# Patient Record
Sex: Female | Born: 1935 | Race: Black or African American | Hispanic: No | Marital: Married | Smoking: Never smoker
Health system: Southern US, Community
[De-identification: ages and names within clinical notes are randomized; demographics above are authoritative.]

## PROBLEM LIST (undated history)

## (undated) DIAGNOSIS — E785 Hyperlipidemia, unspecified: Secondary | ICD-10-CM

## (undated) DIAGNOSIS — R922 Inconclusive mammogram: Secondary | ICD-10-CM

## (undated) DIAGNOSIS — M1990 Unspecified osteoarthritis, unspecified site: Secondary | ICD-10-CM

## (undated) DIAGNOSIS — R9230 Dense breasts, unspecified: Secondary | ICD-10-CM

## (undated) DIAGNOSIS — G4700 Insomnia, unspecified: Secondary | ICD-10-CM

## (undated) DIAGNOSIS — H34219 Partial retinal artery occlusion, unspecified eye: Secondary | ICD-10-CM

## (undated) DIAGNOSIS — I350 Nonrheumatic aortic (valve) stenosis: Secondary | ICD-10-CM

## (undated) DIAGNOSIS — E789 Disorder of lipoprotein metabolism, unspecified: Secondary | ICD-10-CM

## (undated) DIAGNOSIS — F419 Anxiety disorder, unspecified: Secondary | ICD-10-CM

## (undated) DIAGNOSIS — E669 Obesity, unspecified: Secondary | ICD-10-CM

## (undated) DIAGNOSIS — M810 Age-related osteoporosis without current pathological fracture: Secondary | ICD-10-CM

## (undated) DIAGNOSIS — M8580 Other specified disorders of bone density and structure, unspecified site: Secondary | ICD-10-CM

## (undated) DIAGNOSIS — I10 Essential (primary) hypertension: Secondary | ICD-10-CM

## (undated) HISTORY — DX: Inconclusive mammogram: R92.2

## (undated) HISTORY — DX: Anxiety disorder, unspecified: F41.9

## (undated) HISTORY — DX: Essential (primary) hypertension: I10

## (undated) HISTORY — DX: Unspecified osteoarthritis, unspecified site: M19.90

## (undated) HISTORY — DX: Age-related osteoporosis without current pathological fracture: M81.0

## (undated) HISTORY — DX: Partial retinal artery occlusion, unspecified eye: H34.219

## (undated) HISTORY — DX: Insomnia, unspecified: G47.00

## (undated) HISTORY — DX: Hyperlipidemia, unspecified: E78.5

## (undated) HISTORY — DX: Disorder of lipoprotein metabolism, unspecified: E78.9

## (undated) HISTORY — DX: Obesity, unspecified: E66.9

## (undated) HISTORY — DX: Other specified disorders of bone density and structure, unspecified site: M85.80

## (undated) HISTORY — DX: Dense breasts, unspecified: R92.30

---
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
## 1997-10-12 ENCOUNTER — Encounter: Admission: RE | Admit: 1997-10-12 | Discharge: 1998-01-10 | Payer: Self-pay | Admitting: Family Medicine

## 1998-10-22 ENCOUNTER — Other Ambulatory Visit: Admission: RE | Admit: 1998-10-22 | Discharge: 1998-10-22 | Payer: Self-pay | Admitting: Family Medicine

## 1999-02-14 ENCOUNTER — Encounter: Admission: RE | Admit: 1999-02-14 | Discharge: 1999-05-15 | Payer: Self-pay | Admitting: Family Medicine

## 1999-05-16 ENCOUNTER — Encounter: Admission: RE | Admit: 1999-05-16 | Discharge: 1999-08-14 | Payer: Self-pay | Admitting: Family Medicine

## 2000-07-28 ENCOUNTER — Encounter: Payer: Self-pay | Admitting: Emergency Medicine

## 2000-07-28 ENCOUNTER — Emergency Department (HOSPITAL_COMMUNITY): Admission: EM | Admit: 2000-07-28 | Discharge: 2000-07-29 | Payer: Self-pay | Admitting: Emergency Medicine

## 2000-07-31 ENCOUNTER — Encounter: Payer: Self-pay | Admitting: Orthopedic Surgery

## 2000-07-31 ENCOUNTER — Ambulatory Visit (HOSPITAL_COMMUNITY): Admission: RE | Admit: 2000-07-31 | Discharge: 2000-08-02 | Payer: Self-pay | Admitting: Orthopedic Surgery

## 2000-09-11 ENCOUNTER — Encounter: Admission: RE | Admit: 2000-09-11 | Discharge: 2000-12-10 | Payer: Self-pay | Admitting: Orthopedic Surgery

## 2000-12-15 ENCOUNTER — Encounter: Admission: RE | Admit: 2000-12-15 | Discharge: 2001-02-09 | Payer: Self-pay | Admitting: Orthopedic Surgery

## 2001-02-24 ENCOUNTER — Observation Stay (HOSPITAL_COMMUNITY): Admission: RE | Admit: 2001-02-24 | Discharge: 2001-02-25 | Payer: Self-pay | Admitting: Orthopedic Surgery

## 2001-03-18 ENCOUNTER — Encounter: Admission: RE | Admit: 2001-03-18 | Discharge: 2001-06-16 | Payer: Self-pay | Admitting: Orthopedic Surgery

## 2001-05-23 ENCOUNTER — Emergency Department (HOSPITAL_COMMUNITY): Admission: EM | Admit: 2001-05-23 | Discharge: 2001-05-23 | Payer: Self-pay | Admitting: *Deleted

## 2001-05-23 ENCOUNTER — Encounter: Payer: Self-pay | Admitting: Emergency Medicine

## 2001-09-21 ENCOUNTER — Other Ambulatory Visit: Admission: RE | Admit: 2001-09-21 | Discharge: 2001-09-21 | Payer: Self-pay | Admitting: Obstetrics and Gynecology

## 2001-11-12 ENCOUNTER — Encounter: Payer: Self-pay | Admitting: Family Medicine

## 2001-11-12 ENCOUNTER — Encounter: Admission: RE | Admit: 2001-11-12 | Discharge: 2001-11-12 | Payer: Self-pay | Admitting: Family Medicine

## 2001-12-09 ENCOUNTER — Encounter: Payer: Self-pay | Admitting: Orthopedic Surgery

## 2001-12-13 ENCOUNTER — Inpatient Hospital Stay (HOSPITAL_COMMUNITY): Admission: RE | Admit: 2001-12-13 | Discharge: 2001-12-15 | Payer: Self-pay | Admitting: Orthopedic Surgery

## 2001-12-13 ENCOUNTER — Encounter: Payer: Self-pay | Admitting: Orthopedic Surgery

## 2002-02-24 ENCOUNTER — Encounter: Payer: Self-pay | Admitting: Family Medicine

## 2002-02-24 ENCOUNTER — Ambulatory Visit (HOSPITAL_COMMUNITY): Admission: RE | Admit: 2002-02-24 | Discharge: 2002-02-24 | Payer: Self-pay | Admitting: Family Medicine

## 2003-02-07 ENCOUNTER — Other Ambulatory Visit: Admission: RE | Admit: 2003-02-07 | Discharge: 2003-02-07 | Payer: Self-pay | Admitting: Obstetrics and Gynecology

## 2004-02-08 ENCOUNTER — Other Ambulatory Visit: Admission: RE | Admit: 2004-02-08 | Discharge: 2004-02-08 | Payer: Self-pay | Admitting: Obstetrics and Gynecology

## 2004-02-15 ENCOUNTER — Encounter: Admission: RE | Admit: 2004-02-15 | Discharge: 2004-02-15 | Payer: Self-pay | Admitting: Obstetrics and Gynecology

## 2004-10-15 ENCOUNTER — Encounter: Admission: RE | Admit: 2004-10-15 | Discharge: 2004-10-15 | Payer: Self-pay | Admitting: Family Medicine

## 2005-07-16 ENCOUNTER — Other Ambulatory Visit: Admission: RE | Admit: 2005-07-16 | Discharge: 2005-07-16 | Payer: Self-pay | Admitting: Obstetrics and Gynecology

## 2005-08-04 ENCOUNTER — Encounter: Admission: RE | Admit: 2005-08-04 | Discharge: 2005-08-04 | Payer: Self-pay | Admitting: Obstetrics and Gynecology

## 2006-01-31 ENCOUNTER — Emergency Department (HOSPITAL_COMMUNITY): Admission: EM | Admit: 2006-01-31 | Discharge: 2006-01-31 | Payer: Self-pay | Admitting: Emergency Medicine

## 2007-07-07 DIAGNOSIS — G4700 Insomnia, unspecified: Secondary | ICD-10-CM

## 2007-07-07 HISTORY — DX: Insomnia, unspecified: G47.00

## 2007-10-07 ENCOUNTER — Ambulatory Visit (HOSPITAL_COMMUNITY): Admission: RE | Admit: 2007-10-07 | Discharge: 2007-10-07 | Payer: Self-pay | Admitting: Endocrinology

## 2008-10-09 ENCOUNTER — Ambulatory Visit (HOSPITAL_COMMUNITY): Admission: RE | Admit: 2008-10-09 | Discharge: 2008-10-09 | Payer: Self-pay | Admitting: Family Medicine

## 2009-10-10 ENCOUNTER — Ambulatory Visit (HOSPITAL_COMMUNITY): Admission: RE | Admit: 2009-10-10 | Discharge: 2009-10-10 | Payer: Self-pay | Admitting: Family Medicine

## 2010-05-24 NOTE — Op Note (Signed)
[place]. [facility]  Patient:    Susan Randall, Susan Randall                      MRN: [id number] Proc. Date: 07/31/00 Adm. Date:  [id number] Disc. Date: [id number] Attending:  Wende Mott                           Operative Report  PREOPERATIVE DIAGNOSIS: Comminuted left humeral head and neck fracture with angulation.  POSTOPERATIVE DIAGNOSIS: Comminuted left humeral head and neck fracture with angulation.  OPERATION/PROCEDURE: Closed manipulative reduction with percutaneous pinning and fixation of left humeral head with three pins.  SURGEON: Kennieth Rad, M.D.  ANESTHESIA: General.  DESCRIPTION OF PROCEDURE: The patient was taken to the operating room after being given adequate preoperative medication and was placed in a barber chair position.  The C-arm was used to visualize for fracture reduction.  The left shoulder was prepped with DuraPrep and draped in sterile manner.  Manipulative reduction of the humerus of the shoulder was done, followed by good reduction and percutaneous pinning with the use of three K-wires across the fracture site, stabilizing it.  This was visualized with the use of the C-arm.  The pins were cut on the outside and bent and pin covers applied.  Adaptic was applied followed by a compressive dressing and swing and swathe to immobilize. The patient tolerated the procedure quite well and went to the recovery room in stable and satisfactory condition.  The patient was discharged home after 23 hour observation for severe nausea and vomiting after the use of Percocet. The patient was switched over to Darvocet-N 100 one q.4h p.r.n. for pain and Vioxx 25 mg q.d., to return to the office in one week.  The patient was discharged in stable and satisfactory condition.  To continue ice packs. DD:  08/12/00 TD:  08/13/00 Job: [id number]

## 2010-05-24 NOTE — H&P (Signed)
[place]. [facility]  Patient:    Susan Randall, Susan Randall                      MRN: [id number] Adm. Date:  [id number] Disc. Date: [id number] Attending:  Wende Mott                         History and Physical  CHIEF COMPLAINT: Painful swollen left shoulder.  HISTORY OF PRESENT ILLNESS: This is a 77 year old female seen in the office for injury she sustained to her left shoulder.  The patient had a fall at home and went down onto her left shoulder.  The patient initially was seen at [facility]. [facility] Emergency Room and placed in a sling and subsequently followed up in the office.  The patient complained of pain and swelling of the left shoulder and arm.  PAST MEDICAL HISTORY: Appendectomy in the past.  No history of high blood pressure or diabetes.  ALLERGIES: None known.  MEDICATIONS:  1. Multivitamin.  2. Oxycodone.  FAMILY HISTORY: Noncontributory.  SOCIAL HISTORY: Habits, drinks alcohol occasionally.  REVIEW OF SYSTEMS: No cardiac, respiratory, urinary or bowel symptoms.  PHYSICAL EXAMINATION:  VITAL SIGNS: Temperature 98.6 degrees, pulse 98, respirations 16, blood pressure 124/76.  Height 5 feet 4 inches.  Weight 180 pounds.  GENERAL: Alert and oriented, no acute distress.  HEENT: Head normocephalic.  Conjunctivae and sclerae clear.  NECK: Supple.  CHEST: Clear.  CARDIAC: S1 and S2, regular.  EXTREMITIES: Left shoulder ecchymotic, swollen, markedly tender.  Good grip and pinch in left hand with swelling noted down to the fingers.  Pulses intact.  Nail beds pink and moist.  LABORATORY DATA: X-rays reveal comminuted angulated humeral head fracture with intra-articular involvement.  IMPRESSION: Comminuted left humeral head fracture. DD:  08/12/00 TD:  08/13/00 Job: [id number]

## 2010-05-24 NOTE — Op Note (Signed)
Susan Randall, Susan Randall                        ACCOUNT NO.:  [id number]   MEDICAL RECORD NO.:  [id number]                   PATIENT TYPE:   LOCATION:                                       FACILITY:   PHYSICIAN:  Mila Homer. Sherlean Foot, M.D.              DATE OF BIRTH:  1928/10/09   DATE OF PROCEDURE:  12/13/2001  DATE OF DISCHARGE:  12/15/2001                                 OPERATIVE REPORT   PREOPERATIVE DIAGNOSIS:  Post traumatic arthritis of the left shoulder.   POSTOPERATIVE DIAGNOSIS:  Post traumatic arthritis of the left shoulder.   OPERATION PERFORMED:  Left shoulder hemiarthroplasty.   SURGEON:  Mila Homer. Sherlean Foot, M.D.   ASSISTANT:  Jamelle Rushing, P.A.   ANESTHESIA:  General.   INDICATIONS FOR PROCEDURE:  The patient is a greater than a year out from a  proximal humerus fracture which healed in a malaligned position and was  causing pain despite conservative treatment.  Informed consent was obtained  for hemiarthroplasty versus total shoulder arthroplasty.   DESCRIPTION OF PROCEDURE:  The patient was laid supine and administered  general anesthesia.  He was then placed in the beach chair position and the  left shoulder was prepped and draped in the usual sterile fashion.  A  straight incision was made from the coracoid process down to the insertion  of the deltoid anteriorly.  Cautery dissection was used.  I developed flaps.  I then identified the deltopectoral interval, isolated off the vein and  brought that laterally with the deltoid.  I then placed a __________  retractor in place and then elevated the subscapularis tendon and anterior  shoulder capsule and tied those with four stay sutures.  I then removed the  capsule from the undersurface of the subscapularis tendon and placed the  subscap tendon behind the __________ retractor.  It was then obvious that  the head had a healed and a very varus and retroverted position.  I could  not gain access to the canal  without osteotomizing the greater tuberosity.  I therefore did perform an osteotomy of the greater tuberosity and tied the  biceps tendon and pulled it out of the way.  At this point I gained direct  entry to the shaft and reamed up to a size to press-fit in a size 10.  I  then used a head cutting guide to removed what I felt was an adequate height  of bone.  I then broached with a size 9 broach and trialed various heads to  a 52 diameter, 4 mm offset head which gave good stability, good head height  and good coverage.  At this point I irrigated, placed drill holes in the  tuberosity fragments, drill holes in the inferior shaft as well.  At this  point I tamped down the real #10 prosthesis, size 10 prosthesis and placed  fiber wire through a drill hole in the  greater tuberosity fragment through  the lateral fin of the prosthesis and tied it down securely.  This reduced  very nicely.  I then brought the subscap tendon down and closed it to the  anterior bone and then oversewed our rotator cuff split.  At this point we  had excellent coverage, good internal and external rotation with no tension  on the repair.  I then irrigated and closed with interrupted 0 Vicryl in the  deltoid interval, 0 Vicryls in the deep soft tissues, running 2-0 Vicryls  and skin staples.  Dressed with Adaptic, 4 x 4s, sterile ABDs and 2-inch  silk tape and a sling swath mobilizer.  The patient tolerated the procedure  well.   COMPLICATIONS:  None.   DRAINS:  None.   ESTIMATED BLOOD LOSS:  300 cc.                                                Mila Homer. Sherlean Foot, M.D.    SDL/MEDQ  D:  12/13/2001  T:  12/13/2001  Job:  [id number]

## 2010-05-24 NOTE — Op Note (Signed)
[place]. [facility]  Patient:    Susan Randall, Susan Randall Visit Number: [id number] MRN: [id number]          Service Type: OBV Location: [phone] Attending Physician:  Wende Mott Dictated by:   Kennieth Rad, M.D. Proc. Date: 02/24/01 Admit Date:  02/24/2001 Discharge Date: 02/25/2001                             Operative Report  PREOPERATIVE DIAGNOSES:  Impingement syndrome, left shoulder; subacromial bursitis; rule out rotator cuff tear.  POSTOPERATIVE DIAGNOSES:  Impingement syndrome, subacromial bursitis.  PROCEDURE:  Arthroscopy, arthroscopic acromioplasty and decompression and synovectomy of left shoulder.  SURGEON:  Kennieth Rad, M.D.  ANESTHESIA:  DESCRIPTION OF PROCEDURE:  Patient was taken to the operating room after giving adequate preop medication and given general anesthesia and intubated. Patient was placed in a barber chair position.  Left shoulder was prepped with DuraPrep and draped in a sterile manner.  Incision was made posterior over the shoulder.  ______ rod was placed from posterior to anterior.  Anterior incision was made with inflow of water anteriorly.  Arthroscope was placed laterally through a separate one-half-inch incision.  Inspection of the joint revealed tremendously overgrown subacromial bursitis with entrapment down against the rotator cuff, some fibrillation of the cuff tendon, but the rotator cuff was still intact, subacromial chondromalacial changes with fibrillation and fibrosis of the surface.  With the use of the shaver, complete synovectomy was done and smoothing of the undersurface of the acromion.  Coracohumeral ligament was resected starting from posterior to anterior.  Acromioplasty was done and this allowed the shoulder to actually come into a good 90 degrees of abduction without impingement.  Copious and abundant irrigation was done.  Further local debridement of the cuff tendon itself  was also done.  Wounds were then closed with 4-0 nylon.  Marcaine 0.5% was injected into the shoulder.  Compressive dressing was applied. Patient tolerated the procedure quite well and went to recovery room in stable and satisfactory condition.  Patient is being kept in 23-hour observation, to be discharged tomorrow, once the pain is under control.  Patient is to return to the office in one week.   Discharged with Darvocet-N 100 one q.4h. p.r.n. for pain.  Continue ice packs and immobilizing splint and to return to the office in one week.Dictated by: Arva Chafe, M.D. Attending Physician:  Wende Mott DD:  03/10/01 TD:  03/11/01 Job: [id number]

## 2010-05-24 NOTE — H&P (Signed)
Susan Randall, Susan Randall                         ACCOUNT NO.:  [id number]   MEDICAL RECORD NO.:  [id number]                   PATIENT TYPE:  INP   LOCATION:  NA                                   FACILITY:  [facility]   PHYSICIAN:  Mila Homer. Sherlean Foot, M.D.              DATE OF BIRTH:  1928-11-04   DATE OF ADMISSION:  12/13/2001  DATE OF DISCHARGE:                                HISTORY & PHYSICAL   CHIEF COMPLAINT:  Left shoulder pain since July of 2002.   HISTORY OF PRESENT ILLNESS:  This 77 year old, black, female patient  presented to Susan Randall, M.D., with a history of a left proximal  humerus fracture in July of 2002.  That was fixed with percutaneous pinning  by Susan Randall, M.D.  She went through a course of physical therapy, but  continued to have pain in that shoulder and then subsequently underwent a  left shoulder arthroscopy on February 24, 2001, with a subacromial  decompression and synovectomy by Susan Randall.  Since that time, she has been  in physical therapy, but has continued to have pain in that left shoulder.   At this point, the pain is intermittent and is described as a throbbing  sensation located in the deltoid region with radiation down into her elbow.  It has been pretty much the same level since her last surgery with no real  progression of the amount of pain she has.  The pain is present with any  range of motion of the shoulder if she sleeps on that left side and it  decreases if she holds the arm still and at her side.  She is currently  taking some Bextra for pain and that provides some relief.  There is some  popping of the shoulder at times, but no neck pain, paresthesias, grinding,  locking, or any other problems with it.   ALLERGIES:  No known drug allergies.   CURRENT MEDICATIONS:  1. Prempro 0.625/2.5 mg one tablet p.o. q.d.  2. Bextra 20 mg one tablet p.o. q.d.  3. Centrum multivitamins one tablet p.o. q.d.  4. Caltrate 600 mg one tablet p.o.  q.d.   PAST MEDICAL HISTORY:  She does have some problems with intermittent sinus  congestion, but no other multiple medical problems.  She denies any history  of diabetes mellitus, hypertension, thyroid disease, hiatal hernia, peptic  ulcer disease, reflux, heart disease, asthma, or any other chronic medical  condition.   PAST SURGICAL HISTORY:  1. Appendectomy in 1944.  2. Left proximal humerus percutaneous pinning by Susan Randall, M.D., on     July 31, 2000.  3. Left shoulder arthroscopy with subacromial decompression and synovectomy     by Susan Randall, M.D., on February 24, 2001.   SOCIAL HISTORY:  She does drink alcohol just socially, probably two to three  times a month.  She denies any history of cigarette smoking  or drug use.  She is married and has one Randall.  She and her husband live in a two-  story house with two steps into the main entrance.  She is a retired  Programmer, systems.  Her medical doctor is Susan Randall, M.D.   FAMILY HISTORY:  Her mother died at the age of 99 with diabetes and a  stroke.  Her father died at age 29 of old age.  She had one brother who died  at age 56 with a history of alcoholism and pneumonia and one sister who died  at age 71 with cancer.  They were unsure if it was stomach, pancreatic, or  ovarian.  Her Randall is 77 years old and she is alive and well.   REVIEW OF SYMPTOMS:  She does wear partial plate dentures on her upper jaw  line.  She wears glasses.  She complains of some knee pain bilaterally just  with the cold weather.  She does have a living will.  Her power-of-attorney  is __________ Susan Randall.  All other systems are negative and  noncontributory.   PHYSICAL EXAMINATION:  GENERAL APPEARANCE:  A well-developed, well-  nourished, overweight, black female in no acute distress.  She walks without  a limp.  Mood and affect are appropriate.  She talks easily with the  examiner.  HEIGHT:  5 feet 3 inches.  WEIGHT:  190  pounds.  BODY MASS INDEX:  33.  VITAL SIGNS:  Temperature 97.2 degrees Fahrenheit, pulse 84, respirations  22, BP 158/90.  HEENT:  Normocephalic and atraumatic without frontal or maxillary sinus  tenderness to palpation.  Conjunctivae pink.  Sclerae anicteric.  PERRLA.  EOMs intact.  No visible external ear deformities.  Hearing grossly intact.  Tympanic membranes pearly gray bilaterally with good light reflex.  Nose and  nasal septum midline.  Nasal mucosa pink and moist without exudates or  polyps noted.  Buccal mucosa pink and moist.  Good dentition.  Pharynx  without erythema or exudates.  Tongue and uvula midline.  Tongue without  fasciculations.  The uvula rises equally with phonation.  NECK:  No visible masses or lesions noted.  Trachea midline.  No palpable  lymphadenopathy or thyromegaly.  Carotids +2 bilaterally without bruits.  Full range of motion.  Nontender to palpation along the cervical spine.  CARDIOVASCULAR:  Heart rate and rhythm regular.  S1 and S2 present without  rubs, clicks, or murmurs noted.  RESPIRATORY:  Respirations even and unlabored.  Breath sounds clear to  auscultation bilaterally without rales or wheezes noted.  ABDOMEN:  Rounded abdominal contour.  Bowel sounds present x 4 quadrants.  Soft and nontender to palpation without hepatosplenomegaly nor CVA  tenderness.  Nontender to palpation along the entire length of the vertebral  column.  Femoral pulses are +2 bilaterally.  BREASTS:  Deferred at this time.  GENITOURINARY:  Deferred at this time.  RECTAL:  Deferred at this time.  PELVIC:  Deferred at this time.  MUSCULOSKELETAL:  No obvious deformities of bilateral hips, knees, ankles,  and toes.  Full range of motion of these extremities without pain.  DP and  PT pulses are +2.  No lower extremity edema.  She has full range of motion  of her wrists and elbows bilaterally.  Radial pulses are +2.  She has full range of motion of the right shoulder without  pain.  No pain with palpation  about the shoulder on the right.  Forward flexion to 180 degrees.  Abduction  180 degrees.  Full internal and external rotation.  Impingement testing  negative on the right.  On the left shoulder, the skin is intact without  erythema or ecchymosis, but she has four little puncture sites noted about  the top part of her shoulder and one arthroscopy portal site that appears to  be almost an X-type of scar on the anterior aspect of her shoulder.  No  active drainage.  She does have a moderate amount of crepitus with range of  motion of the left shoulder.  She does complain of pain with range of motion  of the left shoulder.  She only has forward flexion to about 90 degrees of  the left shoulder, abduction to 70, markedly decreased external rotation,  and minimally decreased internal rotation of that left shoulder.  Radial  pulses +2.  Arm otherwise neurovascularly intact.  NEUROLOGIC:  Alert and oriented x 3.  Cranial nerves II-XII were grossly  intact.  Strength 5/5 in bilateral upper and lower extremities.  Rapid  alternating movements intact.  Deep tendon reflexes 2+ in bilateral upper  and lower extremities.   RADIOLOGIC FINDINGS:  X-rays taken of her left shoulder in October of 2003  show an old healed humeral head fracture in poor position.  The humeral head  is pointing far into the inferior glenoid.  There is a large spica bone  noted superiorly at the greater tuberosity.   IMPRESSION:  1. Painful left shoulder, status post humeral head fracture with     percutaneous pinning and arthroscopic debridement.  2. Sinus congestion.   PLAN:  The patient will be admitted to [facility] on  December 13, 2001, where she will undergo a left total versus hemishoulder  arthroplasty by Mila Homer. Sherlean Foot, M.D.  She will undergo all of the routine  preoperatively tests and studies prior to this procedure.      Legrand Pitts Duffy, P.A.                       Mila Homer. Sherlean Foot, M.D.    KED/MEDQ  D:  12/07/2001  T:  12/07/2001  Job:  [id number]

## 2010-05-24 NOTE — H&P (Signed)
[place]. [facility]  Patient:    Susan Randall, Susan Randall Visit Number: [id number] MRN: [id number]          Service Type: OBV Location: [phone] Attending Physician:  Wende Mott Dictated by:   Kennieth Rad, M.D. Admit Date:  02/24/2001 Discharge Date: 02/25/2001                           History and Physical  CHIEF COMPLAINT:  Painful stiff left shoulder.  HISTORY OF PRESENT ILLNESS:  This is a 77 year old female who had just recently been treated for a comminuted fracture of the humeral neck and underwent multiple pinning of her fracture.  Patient had undergone physical therapy and done fairly well and had some new onset of pain in the left shoulder and increased difficulty in abducting and forward extending the arm. Patient had some loss of function after some stretching and doing a physical therapy treatment.  PAST MEDICAL HISTORY:  Closed reduction and pinning of left humeral fracture, appendectomy.  No history of high blood pressure or diabetes.  ALLERGIES:  None known.  MEDICATIONS:  Bextra and Premarin.  HABITS:  Occasional use of alcohol.  FAMILY HISTORY:  Noncontributory.  REVIEW OF SYSTEMS:  Basically, patient has been in good health.  No cardiac or respiratory and no urinary or bowel symptoms.  PHYSICAL EXAMINATION:  VITAL SIGNS:  Temperature 98.7, pulse 88, respirations 16, blood pressure 120/70.  Height 5 feet 3 inches.  Weight 180.  HEENT:  Normocephalic.  Eyes:  Conjunctivae and sclerae clear.  NECK:  Supple.  CHEST:  Clear.  CARDIAC:  S1 and S2 regular.  EXTREMITIES:  Left shoulder range of motion is limited, tender anteriorly and laterally, subacromial crepitus, pain on abduction above 70 degrees of abduction and forward extension, pinching sensation about anterolateral aspect of the shoulder.  Neurovascular status is intact.  IMPRESSION: 1. Impingement syndrome, left shoulder. 2. Subacromial bursitis,  left shoulder. Dictated by:   Kennieth Rad, M.D. Attending Physician:  Wende Mott DD:  03/10/01 TD:  03/11/01 Job: [id number]

## 2010-07-24 ENCOUNTER — Ambulatory Visit (HOSPITAL_COMMUNITY): Admission: RE | Admit: 2010-07-24 | Payer: Medicare Other | Source: Ambulatory Visit | Admitting: Ophthalmology

## 2010-09-03 ENCOUNTER — Ambulatory Visit (HOSPITAL_COMMUNITY)
Admission: RE | Admit: 2010-09-03 | Discharge: 2010-09-03 | Disposition: A | Discharge status: Home / Self Care | Payer: Medicare Other | Source: Ambulatory Visit | Attending: Ophthalmology | Admitting: Ophthalmology

## 2010-09-03 DIAGNOSIS — H269 Unspecified cataract: Secondary | ICD-10-CM | POA: Insufficient documentation

## 2010-09-23 ENCOUNTER — Ambulatory Visit (HOSPITAL_COMMUNITY)
Admission: RE | Admit: 2010-09-23 | Discharge: 2010-09-23 | Disposition: A | Discharge status: Home / Self Care | Payer: Medicare Other | Source: Ambulatory Visit | Attending: Ophthalmology | Admitting: Ophthalmology

## 2010-09-23 DIAGNOSIS — H2640 Unspecified secondary cataract: Secondary | ICD-10-CM | POA: Insufficient documentation

## 2010-10-10 ENCOUNTER — Ambulatory Visit (HOSPITAL_COMMUNITY)
Admission: RE | Admit: 2010-10-10 | Discharge: 2010-10-10 | Disposition: A | Discharge status: Home / Self Care | Payer: Medicare Other | Source: Ambulatory Visit | Attending: Ophthalmology | Admitting: Ophthalmology

## 2010-10-10 ENCOUNTER — Ambulatory Visit (HOSPITAL_COMMUNITY): Payer: Medicare Other

## 2010-10-10 DIAGNOSIS — Z01818 Encounter for other preprocedural examination: Secondary | ICD-10-CM | POA: Insufficient documentation

## 2010-10-10 DIAGNOSIS — I10 Essential (primary) hypertension: Secondary | ICD-10-CM | POA: Insufficient documentation

## 2010-10-10 DIAGNOSIS — H44009 Unspecified purulent endophthalmitis, unspecified eye: Secondary | ICD-10-CM | POA: Insufficient documentation

## 2010-10-10 DIAGNOSIS — Z01812 Encounter for preprocedural laboratory examination: Secondary | ICD-10-CM | POA: Insufficient documentation

## 2010-10-10 DIAGNOSIS — Z01810 Encounter for preprocedural cardiovascular examination: Secondary | ICD-10-CM | POA: Insufficient documentation

## 2010-10-10 LAB — SURGICAL PCR SCREEN: MRSA, PCR: NEGATIVE

## 2010-10-10 LAB — BASIC METABOLIC PANEL
Calcium: 9.6 mg/dL (ref 8.4–10.5)
Chloride: 103 mEq/L (ref 96–112)
Creatinine, Ser: 0.95 mg/dL (ref 0.50–1.10)
GFR calc Af Amer: 64 mL/min — ABNORMAL LOW (ref >90)
GFR calc non Af Amer: 55 mL/min — ABNORMAL LOW (ref >90)

## 2010-10-10 LAB — CBC
MCH: 28.1 pg (ref 26.0–34.0)
MCHC: 33.8 g/dL (ref 30.0–36.0)
MCV: 83 fL (ref 78.0–100.0)
Platelets: 155 10*3/uL (ref 150–400)
RDW: 14.7 % (ref 11.5–15.5)
WBC: 7.1 10*3/uL (ref 4.0–10.5)

## 2010-10-15 ENCOUNTER — Ambulatory Visit (HOSPITAL_COMMUNITY): Payer: Medicare Other | Attending: Family Medicine

## 2010-10-15 DIAGNOSIS — M810 Age-related osteoporosis without current pathological fracture: Secondary | ICD-10-CM | POA: Insufficient documentation

## 2010-10-18 ENCOUNTER — Encounter (HOSPITAL_COMMUNITY): Payer: Medicare Other

## 2010-10-23 NOTE — Op Note (Signed)
Susan Randall, Susan Randall               ACCOUNT NO.:  [id number]  MEDICAL RECORD NO.:  [id number]  LOCATION:  [facility]SC                         FACILITY:  [facility]  PHYSICIAN:  Shade Flood, MD       DATE OF BIRTH:  02-10-1928  DATE OF PROCEDURE:  10/10/2010 DATE OF DISCHARGE:  10/10/2010                              OPERATIVE REPORT   PROCEDURE PERFORMED:  Pars plana vitrectomy with intravitreal injection of antibiotics.  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  SURGEON OF RECORD:  Shade Flood, MD  ESTIMATED BLOOD LOSS:  Less than 1 mL.  There were no complications and no specimens for pathology.  The patient was prepared and draped in the usual fashion for ocular surgery on the right eye and a solid lid speculum was placed.  A single trocar cannula was placed at the 7:30 meridian, 3.5 mm posterior to the surgical limbus.  The vitreous cutter attached to a 3-mL syringe was used to remove exudate from the anterior vitreous and this was placed on the back table that was sterile for placement of culture media.  The infusion line was then attached and infusion allowed to run to reconstitute globe volume.  A 15-degree blade was then used to enter the peripheral clear cornea at the 12 o'clock meridian and the infusion was clamped.  A 27-gauge cannula was then used to remove cellular debris and inferior exudate from the anterior chamber.  The syringe also was placed on the back table for culture.  The anterior chamber remained formed. Two additional trocar cannulas were placed at 9:30 and 2:3- and then trocars were removed.  The light pipe and vitreous cutter were inserted and with wide-field contact lens, we were able to visualize stringy exudate in the vitreous cavity involving the vitreous and the posterior vitreous face was attached.  The vitreous cutter was used to debulk the core vitreous and then the vitreous cutter on aspiration mode was used to separate posterior  vitreous face from the retinal surface.  The vitreous, which was clouded with exudate, was then removed up to the vitreous base for 360 degrees.    The patient had scant peripheral retinal blot hemorrhages noted 360 degrees and 4 foci of exudate at the inferior aspect at the anterior vitreous which was dependent portion of the eye.  The individual globs of exudate were removed with vitreous cutter from the inferior preretinal surface.  After completion of vitreous removal, the retina was inspected and there were no peripheral breaks for 360 degrees.  The cannula was removed from the 2:30 meridian and the infusion line was clamped.  The patient was given 4 mg of Decadron intravitreally, 1 mg of Vancomycin intravitreally, and 2.25 mg of ceftazidime intravitreally through the 9:30 cannula.  The cannula was then removed with concomitant closure with a cotton tip applicator and infusion was turned on to reconstitute the globe volume at less than 20 mmHg.  Once the globe was reconstituted, the infusion cannula was removed with concomitant closure of the sclerotomy site using a cotton tip applicator.  The patient was then given 100 mg of Ancef and 0.5 mL of fluid subconjunctivally in the inferior nasal quadrant.  The patient additionally was given vancomycin 25 mg subconjunctivally in the inferior nasal quadrant.  Both injections were done with the tip of the needle visualized in the subconjunctival space and was placed in the nasal quadrant so that it would be far from the sites of the sclerotomies.  The lid speculum was removed and the patient's eye was patched with polymyxin, bacitracin ophthalmic ointment.  A plastic shield was placed and she was uneventfully extubated.  She was transferred from the operating room to postoperative recovery area with stable vital signs.          ______________________________ Shade Flood, MD     GG/MEDQ  D:  10/10/2010  T:  10/10/2010  Job:   [id number]  Electronically Signed by Shade Flood MD on 10/23/2010 02:51:39 PM

## 2010-11-07 LAB — CULTURE, FUNGUS WITHOUT SMEAR

## 2011-02-04 DIAGNOSIS — H2510 Age-related nuclear cataract, unspecified eye: Secondary | ICD-10-CM | POA: Diagnosis not present

## 2011-04-08 DIAGNOSIS — H1045 Other chronic allergic conjunctivitis: Secondary | ICD-10-CM | POA: Diagnosis not present

## 2011-04-08 DIAGNOSIS — H5231 Anisometropia: Secondary | ICD-10-CM | POA: Diagnosis not present

## 2011-04-08 DIAGNOSIS — H269 Unspecified cataract: Secondary | ICD-10-CM | POA: Diagnosis not present

## 2011-04-28 DIAGNOSIS — H269 Unspecified cataract: Secondary | ICD-10-CM | POA: Diagnosis not present

## 2011-05-20 ENCOUNTER — Other Ambulatory Visit: Payer: Self-pay | Admitting: Family Medicine

## 2011-05-20 ENCOUNTER — Ambulatory Visit
Admission: RE | Admit: 2011-05-20 | Discharge: 2011-05-20 | Disposition: A | Discharge status: Home / Self Care | Payer: Medicare Other | Source: Ambulatory Visit | Attending: Family Medicine | Admitting: Family Medicine

## 2011-05-20 DIAGNOSIS — R0689 Other abnormalities of breathing: Secondary | ICD-10-CM

## 2011-05-20 DIAGNOSIS — R609 Edema, unspecified: Secondary | ICD-10-CM | POA: Diagnosis not present

## 2011-05-20 DIAGNOSIS — R05 Cough: Secondary | ICD-10-CM | POA: Diagnosis not present

## 2011-06-03 DIAGNOSIS — R439 Unspecified disturbances of smell and taste: Secondary | ICD-10-CM | POA: Diagnosis not present

## 2011-06-03 DIAGNOSIS — J321 Chronic frontal sinusitis: Secondary | ICD-10-CM | POA: Diagnosis not present

## 2011-06-26 DIAGNOSIS — H2510 Age-related nuclear cataract, unspecified eye: Secondary | ICD-10-CM | POA: Diagnosis not present

## 2011-07-09 DIAGNOSIS — H2510 Age-related nuclear cataract, unspecified eye: Secondary | ICD-10-CM | POA: Diagnosis not present

## 2011-07-11 ENCOUNTER — Other Ambulatory Visit: Payer: Self-pay | Admitting: Family Medicine

## 2011-07-11 DIAGNOSIS — E7800 Pure hypercholesterolemia, unspecified: Secondary | ICD-10-CM

## 2011-07-14 ENCOUNTER — Ambulatory Visit
Admission: RE | Admit: 2011-07-14 | Discharge: 2011-07-14 | Disposition: A | Discharge status: Home / Self Care | Payer: Medicare Other | Source: Ambulatory Visit | Attending: Family Medicine | Admitting: Family Medicine

## 2011-07-14 DIAGNOSIS — I658 Occlusion and stenosis of other precerebral arteries: Secondary | ICD-10-CM | POA: Diagnosis not present

## 2011-07-14 DIAGNOSIS — E7800 Pure hypercholesterolemia, unspecified: Secondary | ICD-10-CM

## 2011-07-21 ENCOUNTER — Encounter: Payer: Self-pay | Admitting: Vascular Surgery

## 2011-07-31 ENCOUNTER — Encounter: Payer: Self-pay | Admitting: Vascular Surgery

## 2011-07-31 DIAGNOSIS — B361 Tinea nigra: Secondary | ICD-10-CM | POA: Diagnosis not present

## 2011-08-01 ENCOUNTER — Encounter: Payer: Self-pay | Admitting: Vascular Surgery

## 2011-08-01 ENCOUNTER — Ambulatory Visit (INDEPENDENT_AMBULATORY_CARE_PROVIDER_SITE_OTHER): Payer: Medicare Other | Admitting: Vascular Surgery

## 2011-08-01 VITALS — BP 152/80 | HR 70 | Temp 98.3°F | Ht 63.0 in | Wt 189.0 lb

## 2011-08-01 DIAGNOSIS — I6523 Occlusion and stenosis of bilateral carotid arteries: Secondary | ICD-10-CM

## 2011-08-01 DIAGNOSIS — I6529 Occlusion and stenosis of unspecified carotid artery: Secondary | ICD-10-CM | POA: Insufficient documentation

## 2011-08-01 DIAGNOSIS — I658 Occlusion and stenosis of other precerebral arteries: Secondary | ICD-10-CM | POA: Diagnosis not present

## 2011-08-01 NOTE — Progress Notes (Signed)
[facility] [place]  New Carotid Patient  Referred by:  Geraldo Pitter, MD [address] [place], [place] [address]  Reason for referral: cholesterol in eye  History of Present Illness  Susan Randall is a 78 y.o. ([birth year]) female who presents with chief complaint: cholesterol in eye.  The patient is in the process of being evaluated for cataract surgery.  On eye exam, she believe her right eye had cholesterol in the eye on exam.  Previous carotid studies demonstrated: RICA <50% stenosis, LICA <50% stenosis.  Patient has no history of TIA or stroke symptom.  The patient has never had amaurosis fugax or monocular blindness.  The patient has never had facial drooping or hemiplegia.  The patient has never had receptive or expressive aphasia.   The patient's risks factors for carotid disease include: hyperlipidemia and hypertension.  Past Medical History  Diagnosis Date  . Hyperlipidemia   . Hypertension   . Cholesterol retinal embolus   . Anxiety   . Arthritis     Past Surgical History  Procedure Date  . Appendectomy 1944    History   Social History  . Marital Status: Married    Spouse Name: N/A    Number of Children: N/A  . Years of Education: N/A   Occupational History  . Not on file.   Social History Main Topics  . Smoking status: Never Smoker   . Smokeless tobacco: Never Used  . Alcohol Use: 1.8 - 2.4 oz/week    3-4 Shots of liquor per week  . Drug Use: No  . Sexually Active: Not on file   Other Topics Concern  . Not on file   Social History Narrative  . No narrative on file    Family History  Problem Relation Age of Onset  . Diabetes Mother   . Heart disease Mother   . Hypertension Mother     Current Outpatient Prescriptions on File Prior to Visit  Medication Sig Dispense Refill  . aliskiren (TEKTURNA) 150 MG tablet Take 150 mg by mouth daily.      . clonazePAM (KLONOPIN) 1 MG tablet Take 1 mg by mouth 2 (two) times daily  as needed.      . meloxicam (MOBIC) 7.5 MG tablet Take 7.5 mg by mouth daily.      . montelukast (SINGULAIR) 10 MG tablet Take 10 mg by mouth at bedtime.      . simvastatin (ZOCOR) 40 MG tablet Take 40 mg by mouth every evening.      . Brompheniramine-Pseudoeph (LODRANE 24D PO) Take by mouth.      . Chlorphen-PE-Acetaminophen (NOREL AD) 4-10-325 MG TABS Take by mouth 4 (four) times daily - after meals and at bedtime.      . fluticasone (FLOVENT DISKUS) 50 MCG/BLIST diskus inhaler Inhale 1 puff into the lungs 2 (two) times daily.      . hyoscyamine (LEVSIN, ANASPAZ) 0.125 MG tablet Take 0.125 mg by mouth every 6 (six) hours as needed.      . zolpidem (AMBIEN CR) 12.5 MG CR tablet Take 12.5 mg by mouth at bedtime as needed.        No Known Allergies  REVIEW OF SYSTEMS:  (Positives checked otherwise negative)  CARDIOVASCULAR: [ ]  chest pain    [ ]  chest pressure    [ ]  palpitations    [ ]  orthopnea   [ ]  dyspnea on exert. [ ]  claudication    [ ]   rest pain     [ ]  DVT     [ ]  phlebitis  PULMONARY:    [ ]  productive cough [ ]  asthma  [ ]  wheezing  NEUROLOGIC:    [ ]  weakness    [ ]  paresthesias   [ ]  aphasia    [ ]  amaurosis    [ ]  dizziness  HEMATOLOGIC:    [ ]  bleeding problems  [ ]  clotting disorders  MUSCULOSKEL: [ ]  joint pain     [ ]  joint swelling  GASTROINTEST:  [ ]   blood in stool   [ ]   hematemesis  GENITOURINARY:   [ ]   dysuria    [ ]   hematuria  PSYCHIATRIC:   [ ]  history of major depression  INTEGUMENTARY: [ ]  rashes    [ ]  ulcers  CONSTITUTIONAL:  [ ]  fever     [ ]  chills  Physical Examination  Filed Vitals:   08/01/11 0918 08/01/11 0919  BP: 127/61 152/80  Pulse: 68 70  Temp: 98.3 F (36.8 C)   TempSrc: Oral   Height: 5\' 3"  (1.6 m)   Weight: 189 lb (85.73 kg)   SpO2: 100%    Body mass index is 33.48 kg/(m^2).  General: A&O x 3, WDWN, obese  Head: [place]/AT  Ear/Nose/Throat: Hearing grossly intact, nares w/o erythema or drainage, oropharynx w/o  Erythema/Exudate  Eyes: PERRLA, EOMI  Neck: Supple, no nuchal rigidity, no palpable LAD  Pulmonary: Sym exp, good air movt, CTAB, no rales, rhonchi, & wheezing  Cardiac: RRR, Nl S1, S2, no Murmurs, rubs or gallops  Vascular: Vessel Right Left  Radial Palpable Palpable  Brachial Palpable Palpable  Carotid Palpable, without bruit Palpable, without bruit  Aorta Non-palpable N/A  Femoral Palpable Palpable  Popliteal Non-palpable Non-palpable  PT Palpable Palpable  DP Palpable Palpable   Gastrointestinal: soft, NTND, -G/R, - HSM, - masses, - CVAT B  Musculoskeletal: M/S 5/5 throughout , Extremities without ischemic changes   Neurologic: CN 2-12 intact , Pain and light touch intact in extremities , Motor exam as listed above  Psychiatric: Judgment intact, Mood & affect appropriate for pt's clinical situation  Dermatologic: See M/S exam for extremity exam, no rashes otherwise noted  Lymph : No Cervical, Axillary, or Inguinal lymphadenopathy   Non-Invasive Vascular Imaging  OUTSIDE CAROTID DUPLEX (Date: 07/14/11):   R ICA stenosis: <50%  R VA: patent and antegrade  L ICA stenosis: <50%  L VA: patent and antegrade  Outside Studies/Documentation 3 pages of outside documents were reviewed including: clinic chart and above carotid duplex.  Medical Decision Making  Susan Randall is a 78 y.o. female who presents with: minimal B ICA stenosis (<50%)   Based on the patient's vascular studies and examination, I have offered the patient: nothing.  I doubt the carotid arteries are the source of this patient's Hollenhorst plaque given the minimal plaque burden seen on carotid duplex.  I discussed in depth with the patient the nature of atherosclerosis, and emphasized the importance of maximal medical management including strict control of blood pressure, blood glucose, and lipid levels, obtaining regular exercise, antiplatelet agents, and cessation of smoking.  The patient is  aware that without maximal medical management the underlying atherosclerotic disease process will progress, limiting the benefit of any interventions.  The patient can follow up as needed.  Thank you for allowing [place] to participate in this patient's care.  Leonides Sake, MD [facility] [place] Office: [phone] Pager: [phone]  08/01/2011, 10:03 AM

## 2011-08-14 DIAGNOSIS — Z1231 Encounter for screening mammogram for malignant neoplasm of breast: Secondary | ICD-10-CM | POA: Diagnosis not present

## 2011-08-25 ENCOUNTER — Other Ambulatory Visit: Payer: Self-pay

## 2011-08-25 ENCOUNTER — Ambulatory Visit (INDEPENDENT_AMBULATORY_CARE_PROVIDER_SITE_OTHER): Payer: Medicare Other | Admitting: Obstetrics and Gynecology

## 2011-08-25 ENCOUNTER — Encounter: Payer: Self-pay | Admitting: Obstetrics and Gynecology

## 2011-08-25 ENCOUNTER — Telehealth: Payer: Self-pay

## 2011-08-25 VITALS — BP 110/74 | Ht 63.0 in | Wt 185.0 lb

## 2011-08-25 DIAGNOSIS — M810 Age-related osteoporosis without current pathological fracture: Secondary | ICD-10-CM | POA: Diagnosis not present

## 2011-08-25 DIAGNOSIS — R634 Abnormal weight loss: Secondary | ICD-10-CM

## 2011-08-25 DIAGNOSIS — Z124 Encounter for screening for malignant neoplasm of cervix: Secondary | ICD-10-CM

## 2011-08-25 DIAGNOSIS — Z1159 Encounter for screening for other viral diseases: Secondary | ICD-10-CM | POA: Diagnosis not present

## 2011-08-25 NOTE — Telephone Encounter (Signed)
referral to [facility] [place] for health tips and weight loss they will contact pt with appt

## 2011-08-25 NOTE — Patient Instructions (Signed)

## 2011-08-25 NOTE — Progress Notes (Signed)
Last Pap: 8/12 WNL: Yes Regular Periods:no Contraception: n/a  Monthly Breast exam:yes Tetanus<39yrs:yes Nl.Bladder Function:yes Daily BMs:yes Healthy Diet:yes Calcium:yes Mammogram:yes Date of Mammogram: 08/2011 Exercise:yes Have often Exercise: everyday Seatbelt: yes Abuse at home: no Stressful work:no Sigmoid-colonoscopy: 2011 wnl per pt BP 110/74  Ht 5\' 3"  (1.6 m)  Wt 185 lb (83.915 kg)  BMI 32.77 kg/m2 Physical Examination: Neck - supple, no significant adenopathy Chest - clear to auscultation, no wheezes, rales or rhonchi, symmetric air entry Heart - normal rate, regular rhythm, normal S1, S2, no murmurs, rubs, clicks or gallops Abdomen - soft, nontender, nondistended, no masses or organomegaly Breasts - breasts appear normal, no suspicious masses, no skin or nipple changes or axillary nodes Pelvic - normal external genitalia, vulva, vagina, cervix, uterus and adnexa, atrophic Rectal - normal rectal, no masses Musculoskeletal - no joint tenderness, deformity or swelling Extremities - peripheral pulses normal, no pedal edema, no clubbing or cyanosis Skin - normal coloration and turgor, no rashes, no suspicious skin lesions noted Normal AEX osteoporosis Pt due for mammogram no colonoscopy due no Pap done yes next due in 3 years RT one year dexa scan for follow up Diet and exercise discussed Bone Density: Yes wnl per pt PCP: Dr. Parke Simmers Change in PMH: no change Change in [id number]

## 2011-08-25 NOTE — Telephone Encounter (Signed)
Message copied by Rolla Plate on Mon Aug 25, 2011  3:00 PM ------      Message from: Jaymes Graff      Created: Mon Aug 25, 2011 10:25 AM       Please refer to [facility] for health tips with weight loss

## 2011-08-27 LAB — PAP IG W/ RFLX HPV ASCU

## 2011-09-01 ENCOUNTER — Ambulatory Visit (INDEPENDENT_AMBULATORY_CARE_PROVIDER_SITE_OTHER): Payer: Medicare Other

## 2011-09-01 ENCOUNTER — Other Ambulatory Visit: Payer: Medicare Other

## 2011-09-01 DIAGNOSIS — M810 Age-related osteoporosis without current pathological fracture: Secondary | ICD-10-CM

## 2011-09-02 DIAGNOSIS — H269 Unspecified cataract: Secondary | ICD-10-CM | POA: Diagnosis not present

## 2011-09-02 DIAGNOSIS — H2510 Age-related nuclear cataract, unspecified eye: Secondary | ICD-10-CM | POA: Diagnosis not present

## 2011-09-15 ENCOUNTER — Telehealth: Payer: Self-pay

## 2011-09-15 NOTE — Telephone Encounter (Signed)
Lm on vm tcb rgd dexa scan results

## 2011-09-16 NOTE — Telephone Encounter (Signed)
Spoke with pt rgd dexa scan pt states she get reclast once a year informed pt will inform ND pt voice understanding

## 2011-09-16 NOTE — Telephone Encounter (Signed)
Lm on vm  tcb rgd results 

## 2011-09-17 ENCOUNTER — Other Ambulatory Visit: Payer: Self-pay | Admitting: Obstetrics and Gynecology

## 2011-09-17 DIAGNOSIS — M810 Age-related osteoporosis without current pathological fracture: Secondary | ICD-10-CM

## 2011-09-23 ENCOUNTER — Ambulatory Visit: Payer: Medicare Other | Admitting: *Deleted

## 2011-09-30 ENCOUNTER — Telehealth: Payer: Self-pay | Admitting: Obstetrics and Gynecology

## 2011-09-30 NOTE — Telephone Encounter (Signed)
ND pt 

## 2011-10-01 DIAGNOSIS — E559 Vitamin D deficiency, unspecified: Secondary | ICD-10-CM | POA: Diagnosis not present

## 2011-10-01 DIAGNOSIS — I10 Essential (primary) hypertension: Secondary | ICD-10-CM | POA: Diagnosis not present

## 2011-10-01 DIAGNOSIS — Z0000 Encounter for general adult medical examination without abnormal findings: Secondary | ICD-10-CM | POA: Diagnosis not present

## 2011-10-01 DIAGNOSIS — R0602 Shortness of breath: Secondary | ICD-10-CM | POA: Diagnosis not present

## 2011-10-01 NOTE — Telephone Encounter (Signed)
Spoke with pt rgd msg pt states she was on reclast for her osteopetrosis but has now decided she no longer wants to due that because of long term effect on liver advised pt will consult with ND to see what she can take for osteopetrosis pt voice understanding

## 2011-10-02 ENCOUNTER — Telehealth: Payer: Self-pay

## 2011-10-02 NOTE — Telephone Encounter (Signed)
Message copied by Rolla Plate on Thu Oct 02, 2011  9:05 AM ------      Message from: Jaymes Graff      Created: Thu Oct 02, 2011 12:15 AM       Tell the patient there are many options for the treatment of osteoporosis and all can have side effects.  She needs to come in for a consultation.  Please send her an ACOG pamphlet on osteoporosis.  Thank you

## 2011-10-02 NOTE — Telephone Encounter (Signed)
Lm on vm tcb rgd previous call 

## 2011-10-06 NOTE — Telephone Encounter (Signed)
Spoke with pt rgd previous call informed per ND need appt to discuss meds pt has appt 10/13/11 at 11;00 pt voice understadning

## 2011-10-08 ENCOUNTER — Encounter: Payer: Medicare Other | Attending: Obstetrics and Gynecology | Admitting: *Deleted

## 2011-10-08 ENCOUNTER — Encounter: Payer: Self-pay | Admitting: *Deleted

## 2011-10-08 VITALS — Ht 63.0 in | Wt 188.7 lb

## 2011-10-08 DIAGNOSIS — E669 Obesity, unspecified: Secondary | ICD-10-CM | POA: Diagnosis not present

## 2011-10-08 DIAGNOSIS — R634 Abnormal weight loss: Secondary | ICD-10-CM

## 2011-10-08 DIAGNOSIS — Z713 Dietary counseling and surveillance: Secondary | ICD-10-CM | POA: Insufficient documentation

## 2011-10-08 NOTE — Progress Notes (Signed)
  Medical Nutrition Therapy:  Appt start time: 1030 end time:  1130.   Assessment:  Primary concerns today: weight gain.   MEDICATIONS: see list   DIETARY INTAKE:  Usual eating pattern includes 3 meals and 2-3 snacks per day. Susan Randall struggles mostly with emotional eating at night  Everyday foods include fruits, vegetables, whole grains, lean proteins.  Avoided foods include none.    24-hr recall:  B ( AM): spinach, kale, pineapple, apple, mango (etc) blended, kellog's high protein cereal with honey; grits, cheese, [place] sausage  Snk ( AM): maybe yogurt or small packet of chips, cup of soup  L ( PM): [place] or ham sandwich on wheat bread with fruit and crystal light Snk ( PM): not usually D ( PM): rice or potato, with meat. Snk ( PM): something sweet Beverages: water  Usual physical activity[facility]  Wants to get back into dancing in evenings  Estimated energy needs: 1500 calories 170 g carbohydrates 112 g protein 42 g fat  Progress Towards Goal(s):  In progress.   Nutritional Diagnosis:  [place]-3.3 Overweight/obesity As related to emotional eating and limited physical activity.  As evidenced by 33.5.    Intervention:  Susan Randall is here today because she has been gaining weight.  She used to be more physically active; [facility] [employer] [facility].  She's participated in [employer] and has learned information about health meal planning.  She typically chooses lean proteins, whole grains, fruits and vegetables.  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  She is not able to get out of the house to exercise or to take time to do enjoyable things for herself.  He is very picky about his foods and doesn't like the healthier foods Susan Randall would like to prepare.  She feels guilty and makes the meal he wants- to her health detriment.  She eats out of stress and eats sweets most nights.  She  mentioned a worker who volunteered to come to the house and watch her husband any night.  Suggested she take that opportunity to go to the [facility] or go take dance classes for herself.  She also mentioned that her husband stays in the bed all morning.  Suggested she take that time for herself to read or to puzzles or another activity she enjoys.  Suggested calling a friend or family member when she's really stressed, taking the junk foods out of the house to remove temptation, and strongly encouraged her to seek help from a mental health professional.  She knows how to prepare healthy meals, she knows what a balanced plate is.  Told her, it's not about the food; it's about the emotions"  She was very receptive to the idea of getting some counseling for herself.    Handouts given during visit include:  Emotional eating  Monitoring/Evaluation:  Dietary intake, exercise, and body weight prn.

## 2011-10-08 NOTE — Patient Instructions (Signed)
Remove junk food from home Prepare balanced meals like learned in [employer] Take time for self care and exercise See counselor

## 2011-10-09 DIAGNOSIS — M810 Age-related osteoporosis without current pathological fracture: Secondary | ICD-10-CM | POA: Diagnosis not present

## 2011-10-13 ENCOUNTER — Encounter: Payer: Medicare Other | Admitting: Obstetrics and Gynecology

## 2011-10-14 DIAGNOSIS — I10 Essential (primary) hypertension: Secondary | ICD-10-CM | POA: Diagnosis not present

## 2011-10-14 DIAGNOSIS — E785 Hyperlipidemia, unspecified: Secondary | ICD-10-CM | POA: Diagnosis not present

## 2011-10-14 DIAGNOSIS — R9431 Abnormal electrocardiogram [ECG] [EKG]: Secondary | ICD-10-CM | POA: Diagnosis not present

## 2011-10-20 DIAGNOSIS — E785 Hyperlipidemia, unspecified: Secondary | ICD-10-CM | POA: Diagnosis not present

## 2011-10-20 DIAGNOSIS — I10 Essential (primary) hypertension: Secondary | ICD-10-CM | POA: Diagnosis not present

## 2011-10-20 DIAGNOSIS — R9431 Abnormal electrocardiogram [ECG] [EKG]: Secondary | ICD-10-CM | POA: Diagnosis not present

## 2011-10-22 DIAGNOSIS — M810 Age-related osteoporosis without current pathological fracture: Secondary | ICD-10-CM | POA: Diagnosis not present

## 2011-10-22 DIAGNOSIS — I10 Essential (primary) hypertension: Secondary | ICD-10-CM | POA: Diagnosis not present

## 2011-10-22 DIAGNOSIS — E785 Hyperlipidemia, unspecified: Secondary | ICD-10-CM | POA: Diagnosis not present

## 2011-10-22 DIAGNOSIS — Z23 Encounter for immunization: Secondary | ICD-10-CM | POA: Diagnosis not present

## 2011-10-22 DIAGNOSIS — E119 Type 2 diabetes mellitus without complications: Secondary | ICD-10-CM | POA: Diagnosis not present

## 2011-12-16 DIAGNOSIS — M25579 Pain in unspecified ankle and joints of unspecified foot: Secondary | ICD-10-CM | POA: Diagnosis not present

## 2012-01-12 DIAGNOSIS — H40029 Open angle with borderline findings, high risk, unspecified eye: Secondary | ICD-10-CM | POA: Diagnosis not present

## 2012-01-20 DIAGNOSIS — E119 Type 2 diabetes mellitus without complications: Secondary | ICD-10-CM | POA: Diagnosis not present

## 2012-01-20 DIAGNOSIS — E7800 Pure hypercholesterolemia, unspecified: Secondary | ICD-10-CM | POA: Diagnosis not present

## 2012-01-20 DIAGNOSIS — I10 Essential (primary) hypertension: Secondary | ICD-10-CM | POA: Diagnosis not present

## 2012-02-02 DIAGNOSIS — Z961 Presence of intraocular lens: Secondary | ICD-10-CM | POA: Diagnosis not present

## 2012-02-02 DIAGNOSIS — H35039 Hypertensive retinopathy, unspecified eye: Secondary | ICD-10-CM | POA: Diagnosis not present

## 2012-02-02 DIAGNOSIS — H43399 Other vitreous opacities, unspecified eye: Secondary | ICD-10-CM | POA: Diagnosis not present

## 2012-02-02 DIAGNOSIS — H40029 Open angle with borderline findings, high risk, unspecified eye: Secondary | ICD-10-CM | POA: Diagnosis not present

## 2012-02-23 DIAGNOSIS — I10 Essential (primary) hypertension: Secondary | ICD-10-CM | POA: Diagnosis not present

## 2012-02-23 DIAGNOSIS — E119 Type 2 diabetes mellitus without complications: Secondary | ICD-10-CM | POA: Diagnosis not present

## 2012-03-16 DIAGNOSIS — E119 Type 2 diabetes mellitus without complications: Secondary | ICD-10-CM | POA: Diagnosis not present

## 2012-03-30 DIAGNOSIS — M25519 Pain in unspecified shoulder: Secondary | ICD-10-CM | POA: Insufficient documentation

## 2012-04-06 DIAGNOSIS — H04129 Dry eye syndrome of unspecified lacrimal gland: Secondary | ICD-10-CM | POA: Diagnosis not present

## 2012-04-06 DIAGNOSIS — H40029 Open angle with borderline findings, high risk, unspecified eye: Secondary | ICD-10-CM | POA: Diagnosis not present

## 2012-06-28 DIAGNOSIS — E781 Pure hyperglyceridemia: Secondary | ICD-10-CM | POA: Diagnosis not present

## 2012-06-28 DIAGNOSIS — F411 Generalized anxiety disorder: Secondary | ICD-10-CM | POA: Diagnosis not present

## 2012-06-28 DIAGNOSIS — I10 Essential (primary) hypertension: Secondary | ICD-10-CM | POA: Diagnosis not present

## 2012-06-28 DIAGNOSIS — E119 Type 2 diabetes mellitus without complications: Secondary | ICD-10-CM | POA: Diagnosis not present

## 2012-07-15 DIAGNOSIS — M25519 Pain in unspecified shoulder: Secondary | ICD-10-CM | POA: Diagnosis not present

## 2012-07-20 DIAGNOSIS — I10 Essential (primary) hypertension: Secondary | ICD-10-CM | POA: Diagnosis not present

## 2012-07-20 DIAGNOSIS — F329 Major depressive disorder, single episode, unspecified: Secondary | ICD-10-CM | POA: Diagnosis not present

## 2012-07-20 DIAGNOSIS — R7309 Other abnormal glucose: Secondary | ICD-10-CM | POA: Diagnosis not present

## 2012-07-20 DIAGNOSIS — F411 Generalized anxiety disorder: Secondary | ICD-10-CM | POA: Diagnosis not present

## 2012-08-11 DIAGNOSIS — M1710 Unilateral primary osteoarthritis, unspecified knee: Secondary | ICD-10-CM | POA: Diagnosis not present

## 2012-08-16 DIAGNOSIS — Z1231 Encounter for screening mammogram for malignant neoplasm of breast: Secondary | ICD-10-CM | POA: Diagnosis not present

## 2012-08-30 DIAGNOSIS — Z124 Encounter for screening for malignant neoplasm of cervix: Secondary | ICD-10-CM | POA: Diagnosis not present

## 2012-09-14 DIAGNOSIS — M1710 Unilateral primary osteoarthritis, unspecified knee: Secondary | ICD-10-CM | POA: Diagnosis not present

## 2012-09-14 DIAGNOSIS — M25569 Pain in unspecified knee: Secondary | ICD-10-CM | POA: Insufficient documentation

## 2012-10-07 DIAGNOSIS — Z23 Encounter for immunization: Secondary | ICD-10-CM | POA: Diagnosis not present

## 2012-10-11 DIAGNOSIS — K625 Hemorrhage of anus and rectum: Secondary | ICD-10-CM | POA: Diagnosis not present

## 2012-10-11 DIAGNOSIS — Z86010 Personal history of colonic polyps: Secondary | ICD-10-CM | POA: Diagnosis not present

## 2012-10-12 ENCOUNTER — Other Ambulatory Visit: Payer: Self-pay | Admitting: Orthopedic Surgery

## 2012-10-12 DIAGNOSIS — M25562 Pain in left knee: Secondary | ICD-10-CM

## 2012-10-19 ENCOUNTER — Ambulatory Visit
Admission: RE | Admit: 2012-10-19 | Discharge: 2012-10-19 | Disposition: A | Discharge status: Home / Self Care | Payer: Medicare Other | Source: Ambulatory Visit | Attending: Orthopedic Surgery | Admitting: Orthopedic Surgery

## 2012-10-19 DIAGNOSIS — M1710 Unilateral primary osteoarthritis, unspecified knee: Secondary | ICD-10-CM | POA: Diagnosis not present

## 2012-10-19 DIAGNOSIS — M2240 Chondromalacia patellae, unspecified knee: Secondary | ICD-10-CM | POA: Diagnosis not present

## 2012-10-19 DIAGNOSIS — IMO0002 Reserved for concepts with insufficient information to code with codable children: Secondary | ICD-10-CM | POA: Diagnosis not present

## 2012-10-19 DIAGNOSIS — M25562 Pain in left knee: Secondary | ICD-10-CM

## 2012-10-20 DIAGNOSIS — I10 Essential (primary) hypertension: Secondary | ICD-10-CM | POA: Diagnosis not present

## 2012-10-20 DIAGNOSIS — E7800 Pure hypercholesterolemia, unspecified: Secondary | ICD-10-CM | POA: Diagnosis not present

## 2012-10-20 DIAGNOSIS — E119 Type 2 diabetes mellitus without complications: Secondary | ICD-10-CM | POA: Diagnosis not present

## 2012-10-21 DIAGNOSIS — M1710 Unilateral primary osteoarthritis, unspecified knee: Secondary | ICD-10-CM | POA: Diagnosis not present

## 2013-01-13 DIAGNOSIS — H40029 Open angle with borderline findings, high risk, unspecified eye: Secondary | ICD-10-CM | POA: Diagnosis not present

## 2013-01-13 DIAGNOSIS — Z961 Presence of intraocular lens: Secondary | ICD-10-CM | POA: Diagnosis not present

## 2013-01-13 DIAGNOSIS — H04129 Dry eye syndrome of unspecified lacrimal gland: Secondary | ICD-10-CM | POA: Diagnosis not present

## 2013-01-13 DIAGNOSIS — H01009 Unspecified blepharitis unspecified eye, unspecified eyelid: Secondary | ICD-10-CM | POA: Diagnosis not present

## 2013-02-03 DIAGNOSIS — H40029 Open angle with borderline findings, high risk, unspecified eye: Secondary | ICD-10-CM | POA: Diagnosis not present

## 2013-02-17 DIAGNOSIS — E781 Pure hyperglyceridemia: Secondary | ICD-10-CM | POA: Diagnosis not present

## 2013-02-17 DIAGNOSIS — I10 Essential (primary) hypertension: Secondary | ICD-10-CM | POA: Diagnosis not present

## 2013-02-17 DIAGNOSIS — E119 Type 2 diabetes mellitus without complications: Secondary | ICD-10-CM | POA: Diagnosis not present

## 2013-02-24 DIAGNOSIS — E119 Type 2 diabetes mellitus without complications: Secondary | ICD-10-CM | POA: Diagnosis not present

## 2013-02-24 DIAGNOSIS — I10 Essential (primary) hypertension: Secondary | ICD-10-CM | POA: Diagnosis not present

## 2013-03-15 DIAGNOSIS — H905 Unspecified sensorineural hearing loss: Secondary | ICD-10-CM | POA: Diagnosis not present

## 2013-05-31 DIAGNOSIS — H04129 Dry eye syndrome of unspecified lacrimal gland: Secondary | ICD-10-CM | POA: Diagnosis not present

## 2013-05-31 DIAGNOSIS — H40029 Open angle with borderline findings, high risk, unspecified eye: Secondary | ICD-10-CM | POA: Diagnosis not present

## 2013-05-31 DIAGNOSIS — H01009 Unspecified blepharitis unspecified eye, unspecified eyelid: Secondary | ICD-10-CM | POA: Diagnosis not present

## 2013-05-31 DIAGNOSIS — H1045 Other chronic allergic conjunctivitis: Secondary | ICD-10-CM | POA: Diagnosis not present

## 2013-06-24 DIAGNOSIS — L259 Unspecified contact dermatitis, unspecified cause: Secondary | ICD-10-CM | POA: Diagnosis not present

## 2013-06-24 DIAGNOSIS — E119 Type 2 diabetes mellitus without complications: Secondary | ICD-10-CM | POA: Diagnosis not present

## 2013-07-25 DIAGNOSIS — I10 Essential (primary) hypertension: Secondary | ICD-10-CM | POA: Diagnosis not present

## 2013-07-25 DIAGNOSIS — E781 Pure hyperglyceridemia: Secondary | ICD-10-CM | POA: Diagnosis not present

## 2013-07-25 DIAGNOSIS — E119 Type 2 diabetes mellitus without complications: Secondary | ICD-10-CM | POA: Diagnosis not present

## 2013-07-26 DIAGNOSIS — I10 Essential (primary) hypertension: Secondary | ICD-10-CM | POA: Diagnosis not present

## 2013-07-26 DIAGNOSIS — E119 Type 2 diabetes mellitus without complications: Secondary | ICD-10-CM | POA: Diagnosis not present

## 2013-08-18 DIAGNOSIS — Z1231 Encounter for screening mammogram for malignant neoplasm of breast: Secondary | ICD-10-CM | POA: Diagnosis not present

## 2013-08-18 DIAGNOSIS — Z803 Family history of malignant neoplasm of breast: Secondary | ICD-10-CM | POA: Diagnosis not present

## 2013-08-31 DIAGNOSIS — Z124 Encounter for screening for malignant neoplasm of cervix: Secondary | ICD-10-CM | POA: Diagnosis not present

## 2013-09-08 DIAGNOSIS — M899 Disorder of bone, unspecified: Secondary | ICD-10-CM | POA: Diagnosis not present

## 2013-09-08 DIAGNOSIS — M949 Disorder of cartilage, unspecified: Secondary | ICD-10-CM | POA: Diagnosis not present

## 2013-10-04 DIAGNOSIS — Z0000 Encounter for general adult medical examination without abnormal findings: Secondary | ICD-10-CM | POA: Diagnosis not present

## 2013-10-11 DIAGNOSIS — Z23 Encounter for immunization: Secondary | ICD-10-CM | POA: Diagnosis not present

## 2013-11-07 ENCOUNTER — Encounter: Payer: Self-pay | Admitting: *Deleted

## 2013-12-23 DIAGNOSIS — I10 Essential (primary) hypertension: Secondary | ICD-10-CM | POA: Diagnosis not present

## 2013-12-23 DIAGNOSIS — E0800 Diabetes mellitus due to underlying condition with hyperosmolarity without nonketotic hyperglycemic-hyperosmolar coma (NKHHC): Secondary | ICD-10-CM | POA: Diagnosis not present

## 2013-12-23 DIAGNOSIS — M810 Age-related osteoporosis without current pathological fracture: Secondary | ICD-10-CM | POA: Diagnosis not present

## 2013-12-24 DIAGNOSIS — I10 Essential (primary) hypertension: Secondary | ICD-10-CM | POA: Diagnosis not present

## 2013-12-24 DIAGNOSIS — E0800 Diabetes mellitus due to underlying condition with hyperosmolarity without nonketotic hyperglycemic-hyperosmolar coma (NKHHC): Secondary | ICD-10-CM | POA: Diagnosis not present

## 2014-01-26 DIAGNOSIS — Z6835 Body mass index (BMI) 35.0-35.9, adult: Secondary | ICD-10-CM | POA: Diagnosis not present

## 2014-01-26 DIAGNOSIS — N898 Other specified noninflammatory disorders of vagina: Secondary | ICD-10-CM | POA: Diagnosis not present

## 2014-03-14 DIAGNOSIS — H1013 Acute atopic conjunctivitis, bilateral: Secondary | ICD-10-CM | POA: Diagnosis not present

## 2014-03-14 DIAGNOSIS — Z961 Presence of intraocular lens: Secondary | ICD-10-CM | POA: Diagnosis not present

## 2014-03-14 DIAGNOSIS — H5213 Myopia, bilateral: Secondary | ICD-10-CM | POA: Diagnosis not present

## 2014-03-14 DIAGNOSIS — H40023 Open angle with borderline findings, high risk, bilateral: Secondary | ICD-10-CM | POA: Diagnosis not present

## 2014-04-26 DIAGNOSIS — E780 Pure hypercholesterolemia: Secondary | ICD-10-CM | POA: Diagnosis not present

## 2014-04-26 DIAGNOSIS — J301 Allergic rhinitis due to pollen: Secondary | ICD-10-CM | POA: Diagnosis not present

## 2014-04-26 DIAGNOSIS — E0800 Diabetes mellitus due to underlying condition with hyperosmolarity without nonketotic hyperglycemic-hyperosmolar coma (NKHHC): Secondary | ICD-10-CM | POA: Diagnosis not present

## 2014-04-26 DIAGNOSIS — E559 Vitamin D deficiency, unspecified: Secondary | ICD-10-CM | POA: Diagnosis not present

## 2014-04-26 DIAGNOSIS — I10 Essential (primary) hypertension: Secondary | ICD-10-CM | POA: Diagnosis not present

## 2014-05-01 DIAGNOSIS — N760 Acute vaginitis: Secondary | ICD-10-CM | POA: Diagnosis not present

## 2014-05-01 DIAGNOSIS — N898 Other specified noninflammatory disorders of vagina: Secondary | ICD-10-CM | POA: Diagnosis not present

## 2014-05-26 DIAGNOSIS — S8002XA Contusion of left knee, initial encounter: Secondary | ICD-10-CM | POA: Diagnosis not present

## 2014-05-26 DIAGNOSIS — S80212A Abrasion, left knee, initial encounter: Secondary | ICD-10-CM | POA: Diagnosis not present

## 2014-05-27 DIAGNOSIS — M79676 Pain in unspecified toe(s): Secondary | ICD-10-CM | POA: Diagnosis not present

## 2014-06-08 IMAGING — US US CAROTID DUPLEX BILAT
1 series · 13 of 16 positions shown · non-contrast
Comparison: None.

CLINICAL DATA: Hypercholesterolemia, hypertension

BILATERAL CAROTID DUPLEX ULTRASOUND
TECHNIQUE: Gray scale imaging, color Doppler and duplex ultrasound
was performed of bilateral carotid and vertebral arteries in the
neck.

[Series 1: us carotid duplex bilat · 0.07mm/px · 13 of 16 slices shown]
[im 1/16]
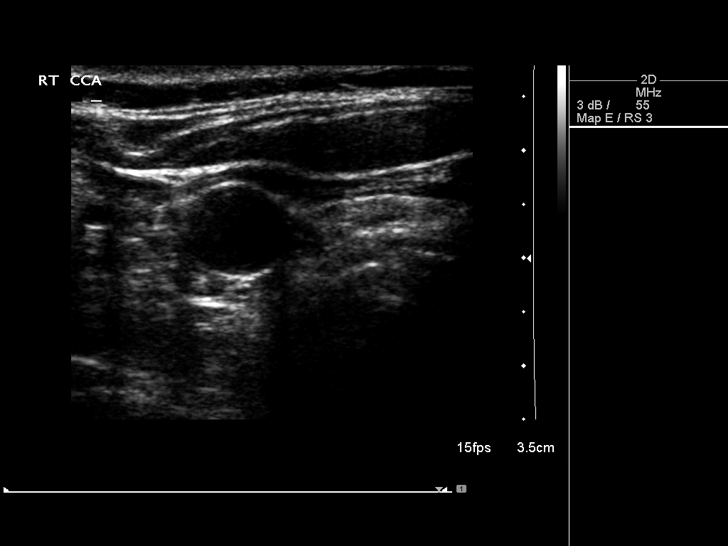
[im 2/16]
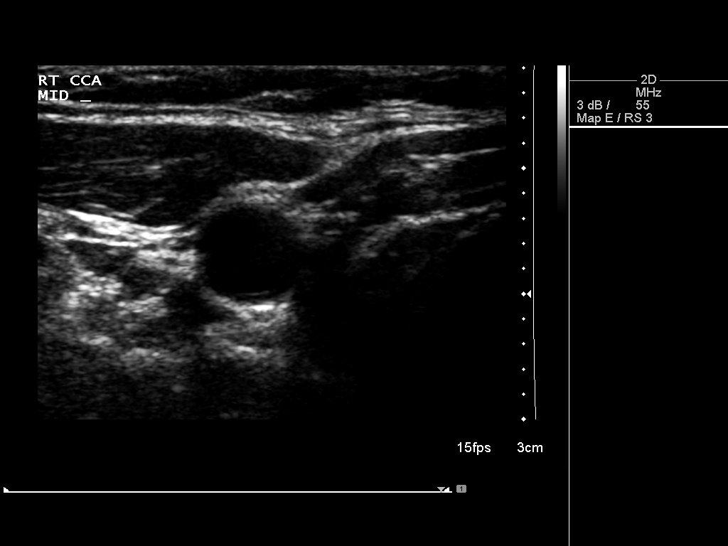
[im 4/16]
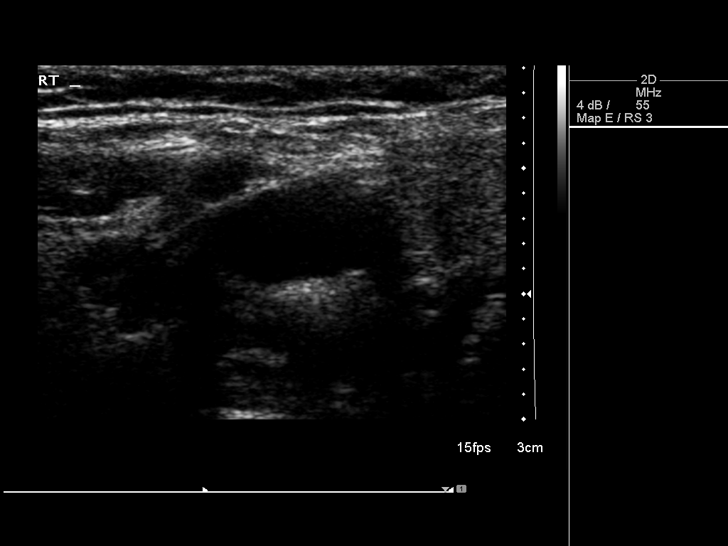
[im 5/16]
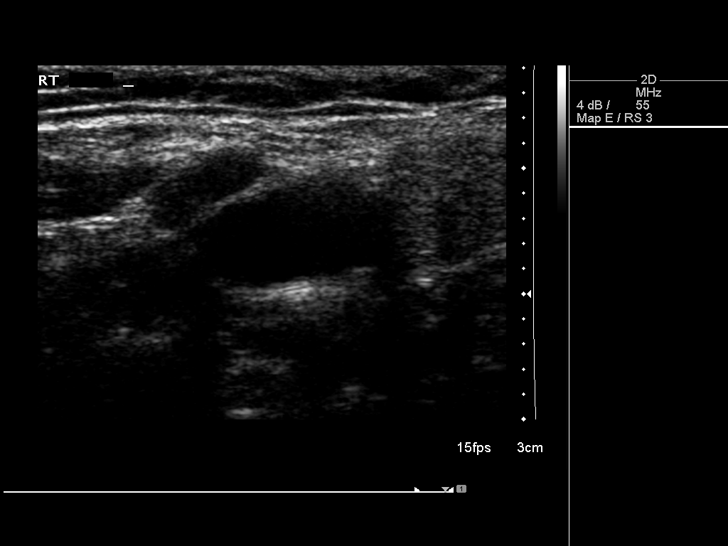
[im 6/16]
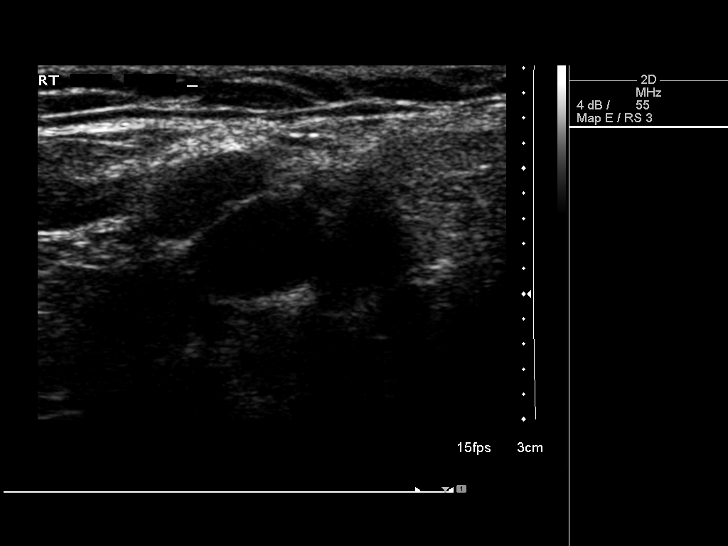
[im 7/16]
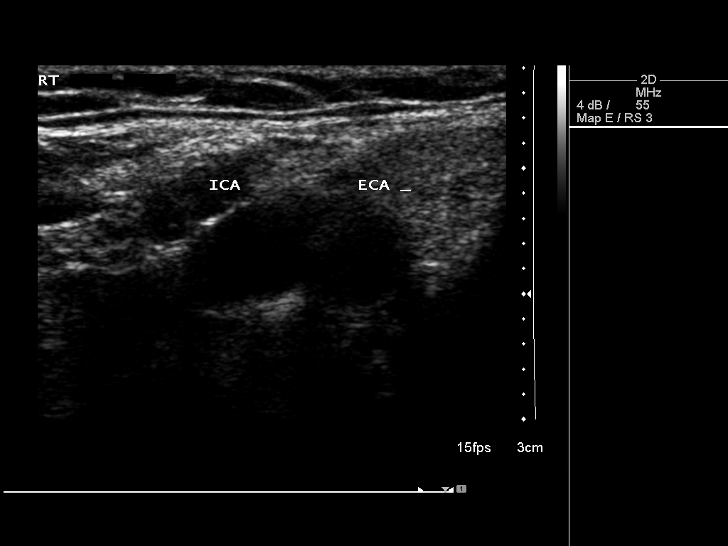
[im 9/16]
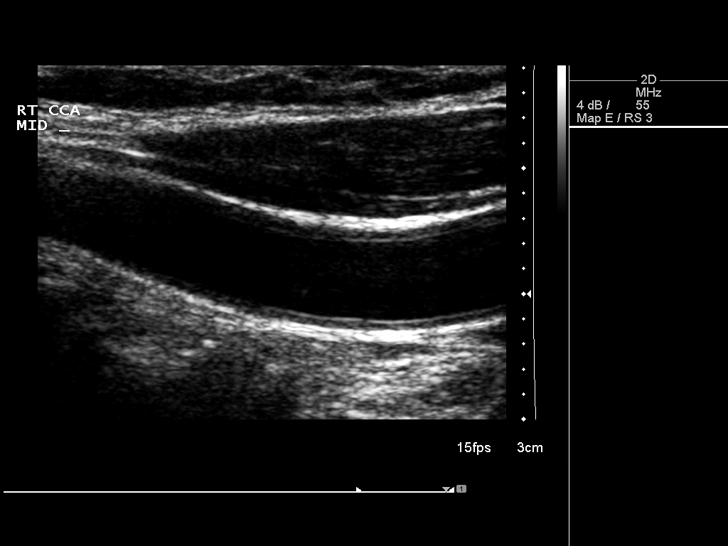
[im 10/16]
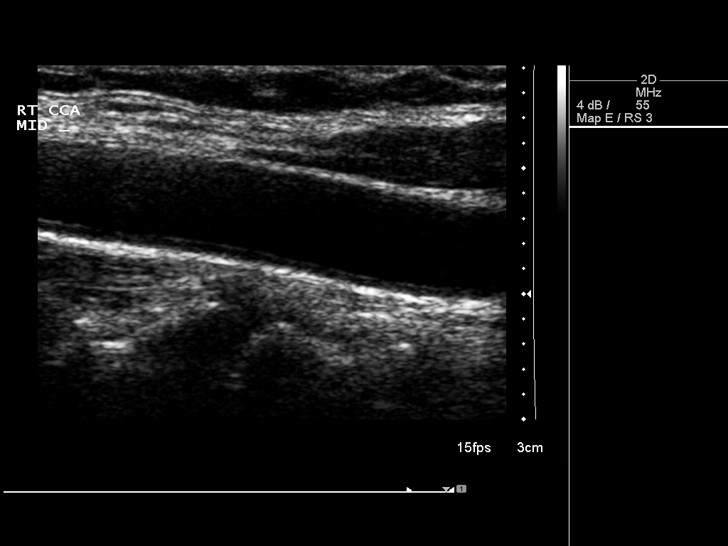
[im 11/16]
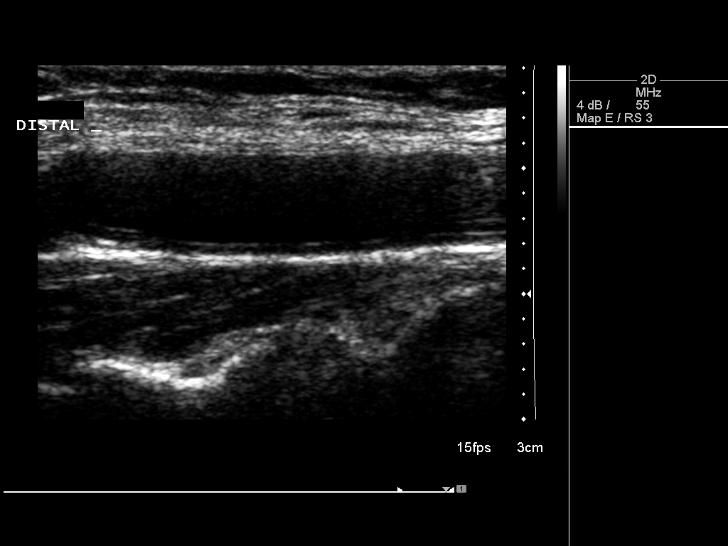
[im 12/16]
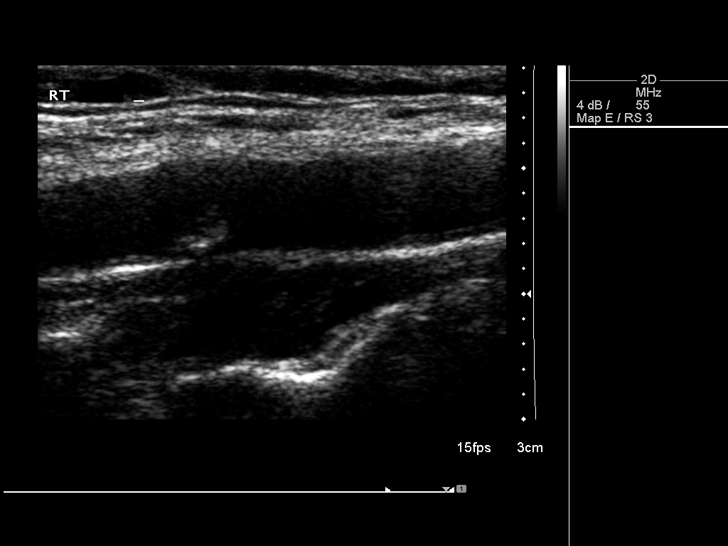
[im 13/16]
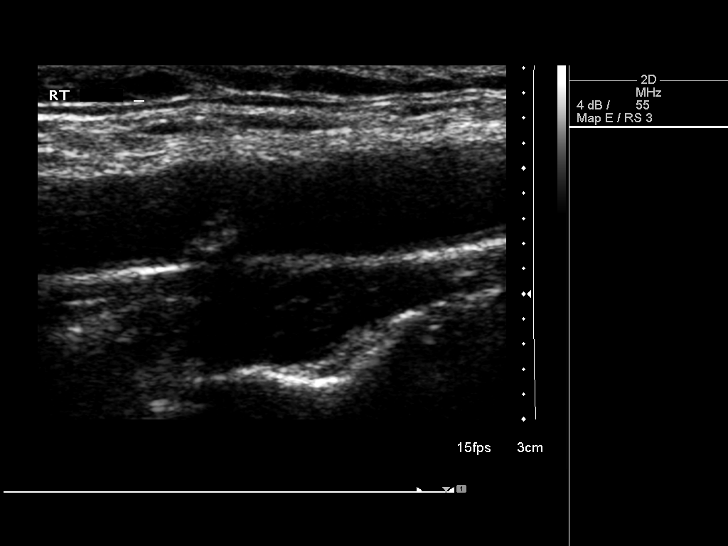
[im 15/16]
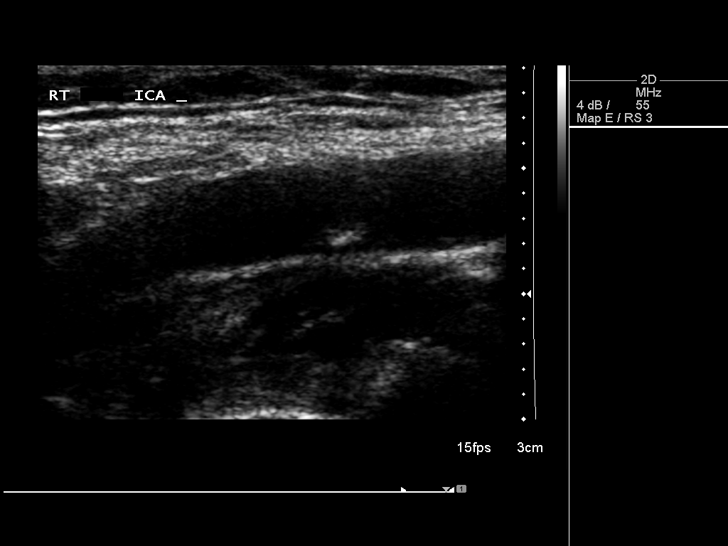
[im 16/16]
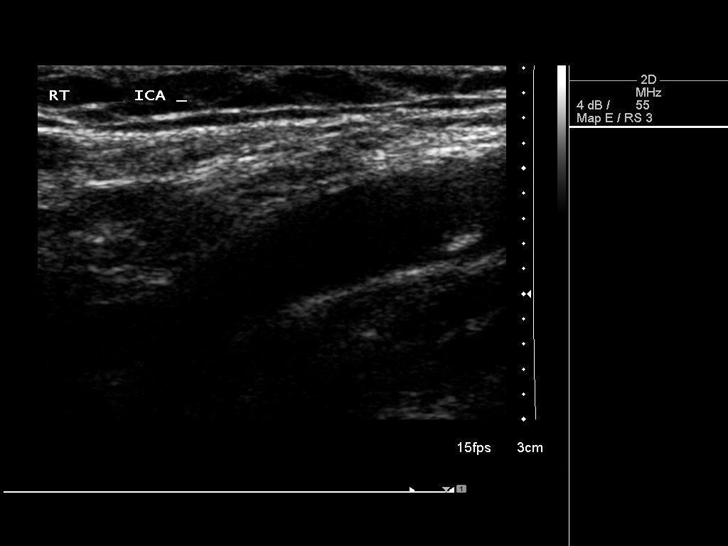

[13 of 16 positions shown; findings below may reference images not displayed]

Criteria:  Quantification of carotid stenosis is based on velocity
parameters that correlate the residual internal carotid diameter
with NASCET-based stenosis levels, using the diameter of the distal
internal carotid lumen as the denominator for stenosis measurement.

The following velocity measurements were obtained:

                 PEAK SYSTOLIC/END DIASTOLIC
RIGHT
ICA:                        74/38cm/sec
CCA:                        81/14cm/sec
SYSTOLIC ICA/CCA RATIO:
DIASTOLIC ICA/CCA RATIO:
ECA:                        74cm/sec

LEFT
ICA:                        77/20cm/sec
CCA:                        80/16cm/sec
SYSTOLIC ICA/CCA RATIO:
DIASTOLIC ICA/CCA RATIO:
ECA:                        84cm/sec
FINDINGS: RIGHT CAROTID ARTERY: There is a focal eccentric area of echogenic
plaque within the right carotid bulb (image 14) which does not
result in elevated peak systolic velocities within the right
internal carotid artery to suggest hemodynamic significance.

RIGHT VERTEBRAL ARTERY:  Antegrade flow

LEFT CAROTID ARTERY: There is a minimal amount of eccentric
echogenic plaque at the origin of the left internal carotid artery
(images 27) which does not result in elevated peak systolic
velocities to suggest hemodynamic significance.

LEFT VERTEBRAL ARTERY:  Antegrade flow
IMPRESSION: Minimal amount of atherosclerotic plaque bilaterally, right greater
than left, not resulting in hemodynamically significant stenosis.

## 2014-08-08 ENCOUNTER — Encounter (HOSPITAL_COMMUNITY): Payer: Self-pay | Admitting: Emergency Medicine

## 2014-08-08 ENCOUNTER — Emergency Department (HOSPITAL_COMMUNITY)
Admission: EM | Admit: 2014-08-08 | Discharge: 2014-08-09 | Disposition: A | Discharge status: Home / Self Care | Payer: Medicare Other | Attending: Emergency Medicine | Admitting: Emergency Medicine

## 2014-08-08 DIAGNOSIS — M1990 Unspecified osteoarthritis, unspecified site: Secondary | ICD-10-CM | POA: Diagnosis not present

## 2014-08-08 DIAGNOSIS — I10 Essential (primary) hypertension: Secondary | ICD-10-CM | POA: Diagnosis not present

## 2014-08-08 DIAGNOSIS — F419 Anxiety disorder, unspecified: Secondary | ICD-10-CM | POA: Insufficient documentation

## 2014-08-08 DIAGNOSIS — Z79899 Other long term (current) drug therapy: Secondary | ICD-10-CM | POA: Diagnosis not present

## 2014-08-08 DIAGNOSIS — E669 Obesity, unspecified: Secondary | ICD-10-CM | POA: Insufficient documentation

## 2014-08-08 DIAGNOSIS — R51 Headache: Secondary | ICD-10-CM | POA: Insufficient documentation

## 2014-08-08 DIAGNOSIS — E785 Hyperlipidemia, unspecified: Secondary | ICD-10-CM | POA: Diagnosis not present

## 2014-08-08 DIAGNOSIS — Z7951 Long term (current) use of inhaled steroids: Secondary | ICD-10-CM | POA: Insufficient documentation

## 2014-08-08 DIAGNOSIS — Z7982 Long term (current) use of aspirin: Secondary | ICD-10-CM | POA: Diagnosis not present

## 2014-08-08 DIAGNOSIS — Z791 Long term (current) use of non-steroidal anti-inflammatories (NSAID): Secondary | ICD-10-CM | POA: Diagnosis not present

## 2014-08-08 DIAGNOSIS — R6884 Jaw pain: Secondary | ICD-10-CM | POA: Diagnosis not present

## 2014-08-08 DIAGNOSIS — R519 Headache, unspecified: Secondary | ICD-10-CM

## 2014-08-08 LAB — CBC WITH DIFFERENTIAL/PLATELET
BASOS ABS: 0 10*3/uL (ref 0.0–0.1)
Basophils Relative: 1 % (ref 0–1)
Eosinophils Absolute: 0.2 10*3/uL (ref 0.0–0.7)
Eosinophils Relative: 3 % (ref 0–5)
HCT: 40.4 % (ref 36.0–46.0)
Hemoglobin: 12.9 g/dL (ref 12.0–15.0)
Lymphocytes Relative: 34 % (ref 12–46)
Lymphs Abs: 2.9 10*3/uL (ref 0.7–4.0)
MCH: 26.7 pg (ref 26.0–34.0)
MCHC: 31.9 g/dL (ref 30.0–36.0)
MCV: 83.5 fL (ref 78.0–100.0)
MONO ABS: 0.6 10*3/uL (ref 0.1–1.0)
MONOS PCT: 7 % (ref 3–12)
Neutro Abs: 5 10*3/uL (ref 1.7–7.7)
Neutrophils Relative %: 57 % (ref 43–77)
PLATELETS: 200 10*3/uL (ref 150–400)
RBC: 4.84 MIL/uL (ref 3.87–5.11)
RDW: 15.4 % (ref 11.5–15.5)
WBC: 8.8 10*3/uL (ref 4.0–10.5)

## 2014-08-08 NOTE — ED Notes (Signed)
Pt. reports intermittent  left temporal headache onset this evening  , mild nausea , no emesis , denies fever or chills.

## 2014-08-09 ENCOUNTER — Encounter (HOSPITAL_COMMUNITY): Payer: Self-pay | Admitting: Radiology

## 2014-08-09 ENCOUNTER — Emergency Department (HOSPITAL_COMMUNITY): Payer: Medicare Other

## 2014-08-09 DIAGNOSIS — I10 Essential (primary) hypertension: Secondary | ICD-10-CM | POA: Diagnosis not present

## 2014-08-09 DIAGNOSIS — E0800 Diabetes mellitus due to underlying condition with hyperosmolarity without nonketotic hyperglycemic-hyperosmolar coma (NKHHC): Secondary | ICD-10-CM | POA: Diagnosis not present

## 2014-08-09 DIAGNOSIS — R6884 Jaw pain: Secondary | ICD-10-CM | POA: Diagnosis not present

## 2014-08-09 DIAGNOSIS — F064 Anxiety disorder due to known physiological condition: Secondary | ICD-10-CM | POA: Diagnosis not present

## 2014-08-09 DIAGNOSIS — H60392 Other infective otitis externa, left ear: Secondary | ICD-10-CM | POA: Diagnosis not present

## 2014-08-09 DIAGNOSIS — E780 Pure hypercholesterolemia: Secondary | ICD-10-CM | POA: Diagnosis not present

## 2014-08-09 DIAGNOSIS — R51 Headache: Secondary | ICD-10-CM | POA: Diagnosis not present

## 2014-08-09 LAB — COMPREHENSIVE METABOLIC PANEL
ALK PHOS: 61 U/L (ref 38–126)
ALT: 15 U/L (ref 14–54)
AST: 25 U/L (ref 15–41)
Albumin: 3.9 g/dL (ref 3.5–5.0)
Anion gap: 8 (ref 5–15)
BILIRUBIN TOTAL: 0.4 mg/dL (ref 0.3–1.2)
BUN: 14 mg/dL (ref 6–20)
CALCIUM: 9 mg/dL (ref 8.9–10.3)
CO2: 26 mmol/L (ref 22–32)
CREATININE: 1 mg/dL (ref 0.44–1.00)
Chloride: 103 mmol/L (ref 101–111)
GFR, EST AFRICAN AMERICAN: 59 mL/min — AB (ref >60)
GFR, EST NON AFRICAN AMERICAN: 51 mL/min — AB (ref >60)
Glucose, Bld: 179 mg/dL — ABNORMAL HIGH (ref 65–99)
POTASSIUM: 3.7 mmol/L (ref 3.5–5.1)
Sodium: 137 mmol/L (ref 135–145)
TOTAL PROTEIN: 6.7 g/dL (ref 6.5–8.1)

## 2014-08-09 LAB — I-STAT TROPONIN, ED: Troponin i, poc: 0 ng/mL (ref 0.00–0.08)

## 2014-08-09 NOTE — ED Notes (Signed)
Pt left with belongings and ambulated with family out of the treatment area.

## 2014-08-09 NOTE — ED Provider Notes (Signed)
CSN: [id number]     Arrival date & time 08/08/14  2313 History   This chart was scribed for Gwyneth Sprout, MD by Arlan Organ, ED Scribe. This patient was seen in room B17C/B17C and the patient's care was started 12:24 AM.   Chief Complaint  Patient presents with  . Headache   The history is provided by the patient. No language interpreter was used.    HPI Comments: Susan Randall is a 81 y.o. female with a PMHx of HTN and hyperlipidemia who presents to the Emergency Department complaining of intermittent, ongoing, improving L sided temporal HA onset this evening at approximately 9 PM this evening. Pt also reports ongoing, intermittent tingling to the L shoulder and toes onset 2-3 months along with mild nausea. No OTC medications or home remedies attempted prior to arrival. She denies any fever, chills, vomiting, cough, congestion, or shortness of breath. [name] denies any recent HA but admits to a history of recurrent HAs last year. Today she also noted general fatigue and not feeling like her normal self. However, she admits to a large amount of stress in her life as her husband has ALS and Parkinson's Disease. No known allergies to medications.  Past Medical History  Diagnosis Date  . Hyperlipidemia   . Hypertension   . Cholesterol retinal embolus   . Anxiety   . Arthritis   . Osteoporosis   . Osteopenia   . Insomnia 7/09  . Breast density     right  . Obesity    Past Surgical History  Procedure Laterality Date  . Appendectomy  1944   Family History  Problem Relation Age of Onset  . Diabetes Mother   . Heart disease Mother   . Hypertension Mother    History  Substance Use Topics  . Smoking status: Never Smoker   . Smokeless tobacco: Never Used  . Alcohol Use: 1.8 - 2.4 oz/week    3-4 Shots of liquor per week   OB History    Gravida Para Term Preterm AB TAB SAB Ectopic Multiple Living   1 1             Review of Systems  Constitutional: Negative for fever  and chills.  HENT: Negative for congestion.   Respiratory: Negative for cough and shortness of breath.   Gastrointestinal: Negative for nausea and vomiting.  Neurological: Positive for headaches.  Psychiatric/Behavioral: Negative for confusion.  All other systems reviewed and are negative.     Allergies  Review of patient's allergies indicates no known allergies.  Home Medications   Prior to Admission medications   Medication Sig Start Date End Date Taking? Authorizing Provider  aliskiren (TEKTURNA) 150 MG tablet Take 150 mg by mouth daily.    Historical Provider, MD  aspirin 81 MG tablet Take 81 mg by mouth daily.    Historical Provider, MD  Brompheniramine-Pseudoeph (LODRANE 24D PO) Take by mouth.    Historical Provider, MD  Chlorphen-PE-Acetaminophen (NOREL AD) 4-10-325 MG TABS Take by mouth 4 (four) times daily - after meals and at bedtime.    Historical Provider, MD  clonazePAM (KLONOPIN) 1 MG tablet Take 1 mg by mouth 2 (two) times daily as needed.    Historical Provider, MD  fluticasone (FLOVENT DISKUS) 50 MCG/BLIST diskus inhaler Inhale 1 puff into the lungs 2 (two) times daily.    Historical Provider, MD  hyoscyamine (LEVSIN, ANASPAZ) 0.125 MG tablet Take 0.125 mg by mouth every 6 (six) hours as needed.  Historical Provider, MD  meloxicam (MOBIC) 7.5 MG tablet Take 7.5 mg by mouth daily.    Historical Provider, MD  montelukast (SINGULAIR) 10 MG tablet Take 10 mg by mouth at bedtime.    Historical Provider, MD  Multiple Vitamins-Minerals (CENTRUM SILVER ULTRA WOMENS PO) Take 1 tablet by mouth daily.    Historical Provider, MD  simvastatin (ZOCOR) 40 MG tablet Take 40 mg by mouth every evening.    Historical Provider, MD  zolpidem (AMBIEN CR) 12.5 MG CR tablet Take 12.5 mg by mouth at bedtime as needed.    Historical Provider, MD   Triage Vitals: BP 158/83 mmHg  Pulse 72  Temp(Src) 97.9 F (36.6 C) (Oral)  Resp 20  Wt 206 lb (93.441 kg)  SpO2 100%   Physical Exam   Constitutional: She is oriented to person, place, and time. She appears well-developed and well-nourished. No distress.  HENT:  Head: Normocephalic and atraumatic.  Right Ear: Hearing, tympanic membrane, external ear and ear canal normal. No tenderness. Tympanic membrane is not injected. No middle ear effusion.  Left Ear: Hearing, tympanic membrane, external ear and ear canal normal. No tenderness. Tympanic membrane is not injected.  No middle ear effusion.  No Tenderness to palpation over temporal artery  Eyes: EOM are normal.  Neck: Normal range of motion.  Cardiovascular: Normal rate, regular rhythm and normal heart sounds.   Pulmonary/Chest: Effort normal and breath sounds normal. No respiratory distress. She has no wheezes. She has no rales.  Abdominal: Soft. She exhibits no distension. There is no tenderness.  Musculoskeletal: Normal range of motion.  Neurological: She is alert and oriented to person, place, and time. She has normal strength. No cranial nerve deficit or sensory deficit.  Skin: Skin is warm and dry.  Psychiatric: She has a normal mood and affect. Judgment normal.  Nursing note and vitals reviewed.   ED Course  Procedures (including critical care time)  DIAGNOSTIC STUDIES: Oxygen Saturation is 100% on RA, Normal by my interpretation.    COORDINATION OF CARE: 12:31 AM- Will order CBC, EKG, Troponin I, CXR, Head CT, and CMP. Discussed treatment plan with pt at bedside and pt agreed to plan.      Labs Review Labs Reviewed  COMPREHENSIVE METABOLIC PANEL - Abnormal; Notable for the following:    Glucose, Bld 179 (*)    GFR calc non Af Amer 51 (*)    GFR calc Af Amer 59 (*)    All other components within normal limits  CBC WITH DIFFERENTIAL/PLATELET  Rosezena Sensor, ED    Imaging Review Dg Chest 2 View  08/09/2014   CLINICAL DATA:  Headache and left-sided jaw pain. Mild dyspnea. Onset tonight.  EXAM: CHEST  2 VIEW  COMPARISON:  05/20/2011  FINDINGS: The heart  size and mediastinal contours are within normal limits. Both lungs are clear. The visualized skeletal structures are unremarkable.  IMPRESSION: No active cardiopulmonary disease.   Electronically Signed   By: Ellery Plunk M.D.   On: 08/09/2014 01:20     EKG Interpretation   Date/Time:  Wednesday August 09 2014 01:17:36 EDT Ventricular Rate:  68 PR Interval:  170 QRS Duration: 75 QT Interval:  676 QTC Calculation: 719 R Axis:   -7 Text Interpretation:  Sinus rhythm Low voltage, precordial leads  Borderline T abnormalities, diffuse leads Prolonged QT interval No  significant change since last tracing Confirmed by Anitra Lauth  MD, Alphonzo Lemmings  [phone]) on 08/09/2014 1:25:20 AM      MDM  Final diagnoses:  Headache    Patient presenting with atypical symptoms of left-sided headache that's been intermittent generalized weakness and fatigue and has been consistent today. Currently the headache is gone she does not normally get headaches. She denies any neck pain chest pain or shortness of breath.  Because of the general fatigue all day long which is different than her baseline will ensure that this is not an atypical cardiac cause. Patient's chest x-ray, EKG and troponin are within normal limits. Also with a headache and generalized weakness will ensure no evidence of bleed or other acute intracranial finding. These were also within normal limits. Low suspicion for subarachnoid hemorrhage as patient came in shortly after the headache started less than 6 hours.  Discussed findings with patient. Her blood sugar is borderline however she has a primary physician who will follow-up.  I personally performed the services described in this documentation, which was scribed in my presence.  The recorded information has been reviewed and considered.   Gwyneth Sprout, MD 08/09/14 [phone]

## 2014-08-09 NOTE — ED Notes (Signed)
Pt reports L shoulder deficits d/t hx shoulder injury, reports noticing for past few months LLE weaker than RLE.

## 2014-08-14 DIAGNOSIS — E119 Type 2 diabetes mellitus without complications: Secondary | ICD-10-CM | POA: Diagnosis not present

## 2014-08-14 DIAGNOSIS — I10 Essential (primary) hypertension: Secondary | ICD-10-CM | POA: Diagnosis not present

## 2014-08-14 DIAGNOSIS — E782 Mixed hyperlipidemia: Secondary | ICD-10-CM | POA: Diagnosis not present

## 2014-08-31 DIAGNOSIS — I10 Essential (primary) hypertension: Secondary | ICD-10-CM | POA: Diagnosis not present

## 2014-08-31 DIAGNOSIS — F064 Anxiety disorder due to known physiological condition: Secondary | ICD-10-CM | POA: Diagnosis not present

## 2014-08-31 DIAGNOSIS — E782 Mixed hyperlipidemia: Secondary | ICD-10-CM | POA: Diagnosis not present

## 2014-08-31 DIAGNOSIS — E0800 Diabetes mellitus due to underlying condition with hyperosmolarity without nonketotic hyperglycemic-hyperosmolar coma (NKHHC): Secondary | ICD-10-CM | POA: Diagnosis not present

## 2014-09-04 DIAGNOSIS — Z01419 Encounter for gynecological examination (general) (routine) without abnormal findings: Secondary | ICD-10-CM | POA: Diagnosis not present

## 2014-09-04 DIAGNOSIS — M8580 Other specified disorders of bone density and structure, unspecified site: Secondary | ICD-10-CM | POA: Diagnosis not present

## 2014-09-04 DIAGNOSIS — E559 Vitamin D deficiency, unspecified: Secondary | ICD-10-CM | POA: Diagnosis not present

## 2014-09-04 DIAGNOSIS — M859 Disorder of bone density and structure, unspecified: Secondary | ICD-10-CM | POA: Diagnosis not present

## 2014-09-05 DIAGNOSIS — Z124 Encounter for screening for malignant neoplasm of cervix: Secondary | ICD-10-CM | POA: Diagnosis not present

## 2014-09-22 DIAGNOSIS — H40023 Open angle with borderline findings, high risk, bilateral: Secondary | ICD-10-CM | POA: Diagnosis not present

## 2014-09-27 DIAGNOSIS — J398 Other specified diseases of upper respiratory tract: Secondary | ICD-10-CM | POA: Diagnosis not present

## 2014-09-27 DIAGNOSIS — E0800 Diabetes mellitus due to underlying condition with hyperosmolarity without nonketotic hyperglycemic-hyperosmolar coma (NKHHC): Secondary | ICD-10-CM | POA: Diagnosis not present

## 2014-10-03 DIAGNOSIS — B360 Pityriasis versicolor: Secondary | ICD-10-CM | POA: Diagnosis not present

## 2014-10-03 DIAGNOSIS — L811 Chloasma: Secondary | ICD-10-CM | POA: Diagnosis not present

## 2014-10-16 DIAGNOSIS — Z23 Encounter for immunization: Secondary | ICD-10-CM | POA: Diagnosis not present

## 2014-10-16 DIAGNOSIS — I10 Essential (primary) hypertension: Secondary | ICD-10-CM | POA: Diagnosis not present

## 2014-10-16 DIAGNOSIS — E0800 Diabetes mellitus due to underlying condition with hyperosmolarity without nonketotic hyperglycemic-hyperosmolar coma (NKHHC): Secondary | ICD-10-CM | POA: Diagnosis not present

## 2014-10-19 DIAGNOSIS — E119 Type 2 diabetes mellitus without complications: Secondary | ICD-10-CM | POA: Diagnosis not present

## 2014-10-19 DIAGNOSIS — E039 Hypothyroidism, unspecified: Secondary | ICD-10-CM | POA: Diagnosis not present

## 2014-10-19 DIAGNOSIS — I10 Essential (primary) hypertension: Secondary | ICD-10-CM | POA: Diagnosis not present

## 2014-11-17 DIAGNOSIS — Z1231 Encounter for screening mammogram for malignant neoplasm of breast: Secondary | ICD-10-CM | POA: Diagnosis not present

## 2015-03-15 DIAGNOSIS — H40023 Open angle with borderline findings, high risk, bilateral: Secondary | ICD-10-CM | POA: Diagnosis not present

## 2015-03-15 DIAGNOSIS — H35032 Hypertensive retinopathy, left eye: Secondary | ICD-10-CM | POA: Diagnosis not present

## 2015-03-15 DIAGNOSIS — Z961 Presence of intraocular lens: Secondary | ICD-10-CM | POA: Diagnosis not present

## 2015-03-15 DIAGNOSIS — H1013 Acute atopic conjunctivitis, bilateral: Secondary | ICD-10-CM | POA: Diagnosis not present

## 2015-03-15 DIAGNOSIS — H04123 Dry eye syndrome of bilateral lacrimal glands: Secondary | ICD-10-CM | POA: Diagnosis not present

## 2015-03-15 DIAGNOSIS — H35031 Hypertensive retinopathy, right eye: Secondary | ICD-10-CM | POA: Diagnosis not present

## 2015-04-12 DIAGNOSIS — Z6836 Body mass index (BMI) 36.0-36.9, adult: Secondary | ICD-10-CM | POA: Diagnosis not present

## 2015-04-12 DIAGNOSIS — E782 Mixed hyperlipidemia: Secondary | ICD-10-CM | POA: Diagnosis not present

## 2015-04-12 DIAGNOSIS — I10 Essential (primary) hypertension: Secondary | ICD-10-CM | POA: Diagnosis not present

## 2015-04-12 DIAGNOSIS — I110 Hypertensive heart disease with heart failure: Secondary | ICD-10-CM | POA: Diagnosis not present

## 2015-04-12 DIAGNOSIS — E039 Hypothyroidism, unspecified: Secondary | ICD-10-CM | POA: Diagnosis not present

## 2015-04-12 DIAGNOSIS — E119 Type 2 diabetes mellitus without complications: Secondary | ICD-10-CM | POA: Diagnosis not present

## 2015-07-09 DIAGNOSIS — Z86010 Personal history of colonic polyps: Secondary | ICD-10-CM | POA: Diagnosis not present

## 2015-07-19 DIAGNOSIS — I8390 Asymptomatic varicose veins of unspecified lower extremity: Secondary | ICD-10-CM | POA: Diagnosis not present

## 2015-07-20 ENCOUNTER — Encounter (HOSPITAL_COMMUNITY): Payer: Medicare Other

## 2015-07-20 ENCOUNTER — Other Ambulatory Visit (HOSPITAL_COMMUNITY): Payer: Self-pay | Admitting: Family Medicine

## 2015-07-20 ENCOUNTER — Other Ambulatory Visit: Payer: Self-pay | Admitting: Family Medicine

## 2015-07-20 DIAGNOSIS — R609 Edema, unspecified: Secondary | ICD-10-CM

## 2015-07-20 DIAGNOSIS — R600 Localized edema: Secondary | ICD-10-CM

## 2015-07-23 ENCOUNTER — Ambulatory Visit (HOSPITAL_COMMUNITY)
Admission: RE | Admit: 2015-07-23 | Discharge: 2015-07-23 | Disposition: A | Discharge status: Home / Self Care | Payer: Medicare Other | Source: Ambulatory Visit | Attending: Family Medicine | Admitting: Family Medicine

## 2015-07-23 DIAGNOSIS — R600 Localized edema: Secondary | ICD-10-CM | POA: Insufficient documentation

## 2015-07-23 DIAGNOSIS — I10 Essential (primary) hypertension: Secondary | ICD-10-CM | POA: Insufficient documentation

## 2015-07-23 DIAGNOSIS — R609 Edema, unspecified: Secondary | ICD-10-CM

## 2015-07-23 DIAGNOSIS — E669 Obesity, unspecified: Secondary | ICD-10-CM | POA: Diagnosis not present

## 2015-07-23 DIAGNOSIS — F419 Anxiety disorder, unspecified: Secondary | ICD-10-CM | POA: Insufficient documentation

## 2015-07-23 DIAGNOSIS — E785 Hyperlipidemia, unspecified: Secondary | ICD-10-CM | POA: Insufficient documentation

## 2015-07-23 NOTE — Progress Notes (Signed)
*  PRELIMINARY RESULTS* Vascular Ultrasound Left lower extremity venous duplex has been completed.  Preliminary findings: No evidence of DVT or baker's cyst.  Called results to [place].   Farrel DemarkJill Eunice, RDMS, RVT  07/23/2015, 8:41 AM

## 2015-08-10 DIAGNOSIS — E7800 Pure hypercholesterolemia, unspecified: Secondary | ICD-10-CM | POA: Diagnosis not present

## 2015-08-10 DIAGNOSIS — F064 Anxiety disorder due to known physiological condition: Secondary | ICD-10-CM | POA: Diagnosis not present

## 2015-08-10 DIAGNOSIS — L2089 Other atopic dermatitis: Secondary | ICD-10-CM | POA: Diagnosis not present

## 2015-08-10 DIAGNOSIS — E785 Hyperlipidemia, unspecified: Secondary | ICD-10-CM | POA: Diagnosis not present

## 2015-08-10 DIAGNOSIS — Z0000 Encounter for general adult medical examination without abnormal findings: Secondary | ICD-10-CM | POA: Diagnosis not present

## 2015-08-10 DIAGNOSIS — I10 Essential (primary) hypertension: Secondary | ICD-10-CM | POA: Diagnosis not present

## 2015-08-10 DIAGNOSIS — E089 Diabetes mellitus due to underlying condition without complications: Secondary | ICD-10-CM | POA: Diagnosis not present

## 2015-09-25 DIAGNOSIS — I10 Essential (primary) hypertension: Secondary | ICD-10-CM | POA: Diagnosis not present

## 2015-09-25 DIAGNOSIS — Z23 Encounter for immunization: Secondary | ICD-10-CM | POA: Diagnosis not present

## 2015-10-02 DIAGNOSIS — L811 Chloasma: Secondary | ICD-10-CM | POA: Diagnosis not present

## 2015-10-02 DIAGNOSIS — L858 Other specified epidermal thickening: Secondary | ICD-10-CM | POA: Diagnosis not present

## 2015-10-08 DIAGNOSIS — M8580 Other specified disorders of bone density and structure, unspecified site: Secondary | ICD-10-CM | POA: Diagnosis not present

## 2015-10-08 DIAGNOSIS — Z01419 Encounter for gynecological examination (general) (routine) without abnormal findings: Secondary | ICD-10-CM | POA: Diagnosis not present

## 2015-10-23 DIAGNOSIS — M25512 Pain in left shoulder: Secondary | ICD-10-CM | POA: Diagnosis not present

## 2015-10-23 DIAGNOSIS — Z96612 Presence of left artificial shoulder joint: Secondary | ICD-10-CM | POA: Diagnosis not present

## 2015-10-23 DIAGNOSIS — G8929 Other chronic pain: Secondary | ICD-10-CM | POA: Diagnosis not present

## 2015-10-23 DIAGNOSIS — M19012 Primary osteoarthritis, left shoulder: Secondary | ICD-10-CM | POA: Diagnosis not present

## 2015-11-05 DIAGNOSIS — I110 Hypertensive heart disease with heart failure: Secondary | ICD-10-CM | POA: Diagnosis not present

## 2015-11-05 DIAGNOSIS — E785 Hyperlipidemia, unspecified: Secondary | ICD-10-CM | POA: Diagnosis not present

## 2015-11-05 DIAGNOSIS — F064 Anxiety disorder due to known physiological condition: Secondary | ICD-10-CM | POA: Diagnosis not present

## 2015-11-05 DIAGNOSIS — Z79899 Other long term (current) drug therapy: Secondary | ICD-10-CM | POA: Diagnosis not present

## 2015-11-05 DIAGNOSIS — J029 Acute pharyngitis, unspecified: Secondary | ICD-10-CM | POA: Diagnosis not present

## 2015-11-05 DIAGNOSIS — E039 Hypothyroidism, unspecified: Secondary | ICD-10-CM | POA: Diagnosis not present

## 2016-01-16 DIAGNOSIS — E089 Diabetes mellitus due to underlying condition without complications: Secondary | ICD-10-CM | POA: Diagnosis not present

## 2016-01-16 DIAGNOSIS — I10 Essential (primary) hypertension: Secondary | ICD-10-CM | POA: Diagnosis not present

## 2016-01-22 DIAGNOSIS — T161XXA Foreign body in right ear, initial encounter: Secondary | ICD-10-CM | POA: Diagnosis not present

## 2016-04-07 DIAGNOSIS — G8929 Other chronic pain: Secondary | ICD-10-CM | POA: Diagnosis not present

## 2016-04-07 DIAGNOSIS — M25612 Stiffness of left shoulder, not elsewhere classified: Secondary | ICD-10-CM | POA: Diagnosis not present

## 2016-04-07 DIAGNOSIS — M25512 Pain in left shoulder: Secondary | ICD-10-CM | POA: Diagnosis not present

## 2016-04-11 DIAGNOSIS — E119 Type 2 diabetes mellitus without complications: Secondary | ICD-10-CM | POA: Diagnosis not present

## 2016-04-11 DIAGNOSIS — M62830 Muscle spasm of back: Secondary | ICD-10-CM | POA: Diagnosis not present

## 2016-04-11 DIAGNOSIS — M545 Low back pain: Secondary | ICD-10-CM | POA: Diagnosis not present

## 2016-04-11 DIAGNOSIS — I10 Essential (primary) hypertension: Secondary | ICD-10-CM | POA: Diagnosis not present

## 2016-04-17 DIAGNOSIS — H35033 Hypertensive retinopathy, bilateral: Secondary | ICD-10-CM | POA: Diagnosis not present

## 2016-04-17 DIAGNOSIS — E089 Diabetes mellitus due to underlying condition without complications: Secondary | ICD-10-CM | POA: Diagnosis not present

## 2016-04-17 DIAGNOSIS — E785 Hyperlipidemia, unspecified: Secondary | ICD-10-CM | POA: Diagnosis not present

## 2016-04-17 DIAGNOSIS — Z961 Presence of intraocular lens: Secondary | ICD-10-CM | POA: Diagnosis not present

## 2016-04-17 DIAGNOSIS — I10 Essential (primary) hypertension: Secondary | ICD-10-CM | POA: Diagnosis not present

## 2016-04-17 DIAGNOSIS — H40023 Open angle with borderline findings, high risk, bilateral: Secondary | ICD-10-CM | POA: Diagnosis not present

## 2016-04-17 DIAGNOSIS — M62830 Muscle spasm of back: Secondary | ICD-10-CM | POA: Diagnosis not present

## 2016-04-17 DIAGNOSIS — H1013 Acute atopic conjunctivitis, bilateral: Secondary | ICD-10-CM | POA: Diagnosis not present

## 2016-04-18 DIAGNOSIS — Z1389 Encounter for screening for other disorder: Secondary | ICD-10-CM | POA: Diagnosis not present

## 2016-04-18 DIAGNOSIS — Z6835 Body mass index (BMI) 35.0-35.9, adult: Secondary | ICD-10-CM | POA: Diagnosis not present

## 2016-04-18 DIAGNOSIS — M25511 Pain in right shoulder: Secondary | ICD-10-CM | POA: Diagnosis not present

## 2016-04-18 DIAGNOSIS — I10 Essential (primary) hypertension: Secondary | ICD-10-CM | POA: Diagnosis not present

## 2016-04-18 DIAGNOSIS — E7800 Pure hypercholesterolemia, unspecified: Secondary | ICD-10-CM | POA: Diagnosis not present

## 2016-04-18 DIAGNOSIS — M545 Low back pain: Secondary | ICD-10-CM | POA: Diagnosis not present

## 2016-04-19 DIAGNOSIS — M791 Myalgia: Secondary | ICD-10-CM | POA: Diagnosis not present

## 2016-04-19 DIAGNOSIS — M62838 Other muscle spasm: Secondary | ICD-10-CM | POA: Diagnosis not present

## 2016-04-19 DIAGNOSIS — M79601 Pain in right arm: Secondary | ICD-10-CM | POA: Diagnosis not present

## 2016-04-19 DIAGNOSIS — M546 Pain in thoracic spine: Secondary | ICD-10-CM | POA: Diagnosis not present

## 2016-04-19 DIAGNOSIS — M62830 Muscle spasm of back: Secondary | ICD-10-CM | POA: Diagnosis not present

## 2016-04-19 DIAGNOSIS — M549 Dorsalgia, unspecified: Secondary | ICD-10-CM | POA: Diagnosis not present

## 2016-04-19 DIAGNOSIS — M25511 Pain in right shoulder: Secondary | ICD-10-CM | POA: Diagnosis not present

## 2016-04-21 DIAGNOSIS — M546 Pain in thoracic spine: Secondary | ICD-10-CM | POA: Diagnosis not present

## 2016-04-21 DIAGNOSIS — S46811D Strain of other muscles, fascia and tendons at shoulder and upper arm level, right arm, subsequent encounter: Secondary | ICD-10-CM | POA: Diagnosis not present

## 2016-04-21 DIAGNOSIS — M25511 Pain in right shoulder: Secondary | ICD-10-CM | POA: Diagnosis not present

## 2016-04-21 DIAGNOSIS — M791 Myalgia: Secondary | ICD-10-CM | POA: Diagnosis not present

## 2016-04-24 DIAGNOSIS — E039 Hypothyroidism, unspecified: Secondary | ICD-10-CM | POA: Diagnosis not present

## 2016-04-24 DIAGNOSIS — E789 Disorder of lipoprotein metabolism, unspecified: Secondary | ICD-10-CM | POA: Diagnosis not present

## 2016-04-24 DIAGNOSIS — I110 Hypertensive heart disease with heart failure: Secondary | ICD-10-CM | POA: Diagnosis not present

## 2016-04-24 DIAGNOSIS — M62830 Muscle spasm of back: Secondary | ICD-10-CM | POA: Diagnosis not present

## 2016-04-24 DIAGNOSIS — Z6835 Body mass index (BMI) 35.0-35.9, adult: Secondary | ICD-10-CM | POA: Diagnosis not present

## 2016-04-24 DIAGNOSIS — I10 Essential (primary) hypertension: Secondary | ICD-10-CM | POA: Diagnosis not present

## 2016-04-24 DIAGNOSIS — E0800 Diabetes mellitus due to underlying condition with hyperosmolarity without nonketotic hyperglycemic-hyperosmolar coma (NKHHC): Secondary | ICD-10-CM | POA: Diagnosis not present

## 2016-04-28 DIAGNOSIS — Z96619 Presence of unspecified artificial shoulder joint: Secondary | ICD-10-CM | POA: Diagnosis not present

## 2016-04-28 DIAGNOSIS — M50322 Other cervical disc degeneration at C5-C6 level: Secondary | ICD-10-CM | POA: Diagnosis not present

## 2016-04-28 DIAGNOSIS — M25512 Pain in left shoulder: Secondary | ICD-10-CM | POA: Diagnosis not present

## 2016-04-30 DIAGNOSIS — M5412 Radiculopathy, cervical region: Secondary | ICD-10-CM | POA: Diagnosis not present

## 2016-04-30 DIAGNOSIS — M25512 Pain in left shoulder: Secondary | ICD-10-CM | POA: Diagnosis not present

## 2016-04-30 DIAGNOSIS — M25511 Pain in right shoulder: Secondary | ICD-10-CM | POA: Diagnosis not present

## 2016-05-06 DIAGNOSIS — M25511 Pain in right shoulder: Secondary | ICD-10-CM | POA: Diagnosis not present

## 2016-05-06 DIAGNOSIS — M5412 Radiculopathy, cervical region: Secondary | ICD-10-CM | POA: Diagnosis not present

## 2016-05-06 DIAGNOSIS — M25512 Pain in left shoulder: Secondary | ICD-10-CM | POA: Diagnosis not present

## 2016-05-06 DIAGNOSIS — G8929 Other chronic pain: Secondary | ICD-10-CM | POA: Diagnosis not present

## 2016-05-06 DIAGNOSIS — M25612 Stiffness of left shoulder, not elsewhere classified: Secondary | ICD-10-CM | POA: Diagnosis not present

## 2016-05-08 DIAGNOSIS — M25512 Pain in left shoulder: Secondary | ICD-10-CM | POA: Diagnosis not present

## 2016-05-08 DIAGNOSIS — M25612 Stiffness of left shoulder, not elsewhere classified: Secondary | ICD-10-CM | POA: Diagnosis not present

## 2016-05-08 DIAGNOSIS — M5412 Radiculopathy, cervical region: Secondary | ICD-10-CM | POA: Diagnosis not present

## 2016-05-08 DIAGNOSIS — M25511 Pain in right shoulder: Secondary | ICD-10-CM | POA: Diagnosis not present

## 2016-05-08 DIAGNOSIS — G8929 Other chronic pain: Secondary | ICD-10-CM | POA: Diagnosis not present

## 2016-05-10 DIAGNOSIS — R61 Generalized hyperhidrosis: Secondary | ICD-10-CM | POA: Diagnosis not present

## 2016-05-10 DIAGNOSIS — R5381 Other malaise: Secondary | ICD-10-CM | POA: Diagnosis not present

## 2016-05-12 DIAGNOSIS — I10 Essential (primary) hypertension: Secondary | ICD-10-CM | POA: Diagnosis not present

## 2016-05-13 DIAGNOSIS — G8929 Other chronic pain: Secondary | ICD-10-CM | POA: Diagnosis not present

## 2016-05-13 DIAGNOSIS — M25511 Pain in right shoulder: Secondary | ICD-10-CM | POA: Diagnosis not present

## 2016-05-13 DIAGNOSIS — M25512 Pain in left shoulder: Secondary | ICD-10-CM | POA: Diagnosis not present

## 2016-05-13 DIAGNOSIS — M5412 Radiculopathy, cervical region: Secondary | ICD-10-CM | POA: Diagnosis not present

## 2016-05-13 DIAGNOSIS — M25612 Stiffness of left shoulder, not elsewhere classified: Secondary | ICD-10-CM | POA: Diagnosis not present

## 2016-05-15 DIAGNOSIS — G8929 Other chronic pain: Secondary | ICD-10-CM | POA: Diagnosis not present

## 2016-05-15 DIAGNOSIS — M5412 Radiculopathy, cervical region: Secondary | ICD-10-CM | POA: Diagnosis not present

## 2016-05-15 DIAGNOSIS — M25612 Stiffness of left shoulder, not elsewhere classified: Secondary | ICD-10-CM | POA: Diagnosis not present

## 2016-05-15 DIAGNOSIS — M25511 Pain in right shoulder: Secondary | ICD-10-CM | POA: Diagnosis not present

## 2016-05-15 DIAGNOSIS — M25512 Pain in left shoulder: Secondary | ICD-10-CM | POA: Diagnosis not present

## 2016-05-20 DIAGNOSIS — M25612 Stiffness of left shoulder, not elsewhere classified: Secondary | ICD-10-CM | POA: Diagnosis not present

## 2016-05-20 DIAGNOSIS — M25512 Pain in left shoulder: Secondary | ICD-10-CM | POA: Diagnosis not present

## 2016-05-20 DIAGNOSIS — M25511 Pain in right shoulder: Secondary | ICD-10-CM | POA: Diagnosis not present

## 2016-05-20 DIAGNOSIS — M5412 Radiculopathy, cervical region: Secondary | ICD-10-CM | POA: Diagnosis not present

## 2016-05-20 DIAGNOSIS — G8929 Other chronic pain: Secondary | ICD-10-CM | POA: Diagnosis not present

## 2016-05-22 DIAGNOSIS — M25612 Stiffness of left shoulder, not elsewhere classified: Secondary | ICD-10-CM | POA: Diagnosis not present

## 2016-05-22 DIAGNOSIS — R0789 Other chest pain: Secondary | ICD-10-CM | POA: Diagnosis not present

## 2016-05-22 DIAGNOSIS — R079 Chest pain, unspecified: Secondary | ICD-10-CM | POA: Diagnosis not present

## 2016-05-22 DIAGNOSIS — M25511 Pain in right shoulder: Secondary | ICD-10-CM | POA: Diagnosis not present

## 2016-05-22 DIAGNOSIS — E119 Type 2 diabetes mellitus without complications: Secondary | ICD-10-CM | POA: Diagnosis not present

## 2016-05-22 DIAGNOSIS — M5412 Radiculopathy, cervical region: Secondary | ICD-10-CM | POA: Diagnosis not present

## 2016-05-22 DIAGNOSIS — Z794 Long term (current) use of insulin: Secondary | ICD-10-CM | POA: Diagnosis not present

## 2016-05-22 DIAGNOSIS — I10 Essential (primary) hypertension: Secondary | ICD-10-CM | POA: Diagnosis not present

## 2016-05-22 DIAGNOSIS — E1165 Type 2 diabetes mellitus with hyperglycemia: Secondary | ICD-10-CM | POA: Diagnosis not present

## 2016-05-22 DIAGNOSIS — I493 Ventricular premature depolarization: Secondary | ICD-10-CM | POA: Diagnosis not present

## 2016-05-22 DIAGNOSIS — R072 Precordial pain: Secondary | ICD-10-CM | POA: Diagnosis not present

## 2016-05-22 DIAGNOSIS — R531 Weakness: Secondary | ICD-10-CM | POA: Diagnosis not present

## 2016-05-22 DIAGNOSIS — G8929 Other chronic pain: Secondary | ICD-10-CM | POA: Diagnosis not present

## 2016-05-22 DIAGNOSIS — M25512 Pain in left shoulder: Secondary | ICD-10-CM | POA: Diagnosis not present

## 2016-05-23 DIAGNOSIS — I493 Ventricular premature depolarization: Secondary | ICD-10-CM | POA: Diagnosis not present

## 2016-05-23 DIAGNOSIS — I10 Essential (primary) hypertension: Secondary | ICD-10-CM | POA: Diagnosis not present

## 2016-05-23 DIAGNOSIS — R079 Chest pain, unspecified: Secondary | ICD-10-CM | POA: Diagnosis not present

## 2016-05-23 DIAGNOSIS — E1165 Type 2 diabetes mellitus with hyperglycemia: Secondary | ICD-10-CM | POA: Diagnosis not present

## 2016-05-23 DIAGNOSIS — Z794 Long term (current) use of insulin: Secondary | ICD-10-CM | POA: Diagnosis not present

## 2016-05-27 DIAGNOSIS — M25511 Pain in right shoulder: Secondary | ICD-10-CM | POA: Diagnosis not present

## 2016-05-27 DIAGNOSIS — M5412 Radiculopathy, cervical region: Secondary | ICD-10-CM | POA: Diagnosis not present

## 2016-05-27 DIAGNOSIS — M25612 Stiffness of left shoulder, not elsewhere classified: Secondary | ICD-10-CM | POA: Diagnosis not present

## 2016-05-27 DIAGNOSIS — M25512 Pain in left shoulder: Secondary | ICD-10-CM | POA: Diagnosis not present

## 2016-05-27 DIAGNOSIS — G8929 Other chronic pain: Secondary | ICD-10-CM | POA: Diagnosis not present

## 2016-05-29 DIAGNOSIS — Z96612 Presence of left artificial shoulder joint: Secondary | ICD-10-CM | POA: Diagnosis not present

## 2016-05-29 DIAGNOSIS — E089 Diabetes mellitus due to underlying condition without complications: Secondary | ICD-10-CM | POA: Diagnosis not present

## 2016-05-29 DIAGNOSIS — T8484XA Pain due to internal orthopedic prosthetic devices, implants and grafts, initial encounter: Secondary | ICD-10-CM | POA: Diagnosis not present

## 2016-05-29 DIAGNOSIS — Z9889 Other specified postprocedural states: Secondary | ICD-10-CM | POA: Diagnosis not present

## 2016-05-29 DIAGNOSIS — I10 Essential (primary) hypertension: Secondary | ICD-10-CM | POA: Diagnosis not present

## 2016-05-29 DIAGNOSIS — S42202S Unspecified fracture of upper end of left humerus, sequela: Secondary | ICD-10-CM | POA: Diagnosis not present

## 2016-05-29 DIAGNOSIS — Z79899 Other long term (current) drug therapy: Secondary | ICD-10-CM | POA: Diagnosis not present

## 2016-05-29 DIAGNOSIS — F064 Anxiety disorder due to known physiological condition: Secondary | ICD-10-CM | POA: Diagnosis not present

## 2016-05-29 DIAGNOSIS — E782 Mixed hyperlipidemia: Secondary | ICD-10-CM | POA: Diagnosis not present

## 2016-05-29 DIAGNOSIS — Z7982 Long term (current) use of aspirin: Secondary | ICD-10-CM | POA: Diagnosis not present

## 2016-05-29 DIAGNOSIS — I110 Hypertensive heart disease with heart failure: Secondary | ICD-10-CM | POA: Diagnosis not present

## 2016-05-29 DIAGNOSIS — Z471 Aftercare following joint replacement surgery: Secondary | ICD-10-CM | POA: Diagnosis not present

## 2016-05-29 DIAGNOSIS — S42202A Unspecified fracture of upper end of left humerus, initial encounter for closed fracture: Secondary | ICD-10-CM | POA: Insufficient documentation

## 2016-05-30 DIAGNOSIS — M25512 Pain in left shoulder: Secondary | ICD-10-CM | POA: Diagnosis not present

## 2016-05-30 DIAGNOSIS — M5412 Radiculopathy, cervical region: Secondary | ICD-10-CM | POA: Diagnosis not present

## 2016-05-30 DIAGNOSIS — G8929 Other chronic pain: Secondary | ICD-10-CM | POA: Diagnosis not present

## 2016-05-30 DIAGNOSIS — M25612 Stiffness of left shoulder, not elsewhere classified: Secondary | ICD-10-CM | POA: Diagnosis not present

## 2016-05-30 DIAGNOSIS — M25511 Pain in right shoulder: Secondary | ICD-10-CM | POA: Diagnosis not present

## 2016-06-04 DIAGNOSIS — G8929 Other chronic pain: Secondary | ICD-10-CM | POA: Diagnosis not present

## 2016-06-04 DIAGNOSIS — M5412 Radiculopathy, cervical region: Secondary | ICD-10-CM | POA: Diagnosis not present

## 2016-06-04 DIAGNOSIS — M25511 Pain in right shoulder: Secondary | ICD-10-CM | POA: Diagnosis not present

## 2016-06-25 DIAGNOSIS — R202 Paresthesia of skin: Secondary | ICD-10-CM | POA: Diagnosis not present

## 2016-06-25 DIAGNOSIS — R200 Anesthesia of skin: Secondary | ICD-10-CM | POA: Diagnosis not present

## 2016-06-25 DIAGNOSIS — M25511 Pain in right shoulder: Secondary | ICD-10-CM | POA: Diagnosis not present

## 2016-06-25 DIAGNOSIS — M25612 Stiffness of left shoulder, not elsewhere classified: Secondary | ICD-10-CM | POA: Diagnosis not present

## 2016-06-25 DIAGNOSIS — G8929 Other chronic pain: Secondary | ICD-10-CM | POA: Diagnosis not present

## 2016-06-25 DIAGNOSIS — M5412 Radiculopathy, cervical region: Secondary | ICD-10-CM | POA: Diagnosis not present

## 2016-07-02 DIAGNOSIS — G8929 Other chronic pain: Secondary | ICD-10-CM | POA: Diagnosis not present

## 2016-07-02 DIAGNOSIS — M5412 Radiculopathy, cervical region: Secondary | ICD-10-CM | POA: Diagnosis not present

## 2016-07-02 DIAGNOSIS — R200 Anesthesia of skin: Secondary | ICD-10-CM | POA: Diagnosis not present

## 2016-07-02 DIAGNOSIS — R202 Paresthesia of skin: Secondary | ICD-10-CM | POA: Diagnosis not present

## 2016-07-02 DIAGNOSIS — M25511 Pain in right shoulder: Secondary | ICD-10-CM | POA: Diagnosis not present

## 2016-07-02 DIAGNOSIS — M25612 Stiffness of left shoulder, not elsewhere classified: Secondary | ICD-10-CM | POA: Diagnosis not present

## 2016-07-04 DIAGNOSIS — M25612 Stiffness of left shoulder, not elsewhere classified: Secondary | ICD-10-CM | POA: Diagnosis not present

## 2016-07-04 DIAGNOSIS — G8929 Other chronic pain: Secondary | ICD-10-CM | POA: Diagnosis not present

## 2016-07-04 DIAGNOSIS — R202 Paresthesia of skin: Secondary | ICD-10-CM | POA: Diagnosis not present

## 2016-07-04 DIAGNOSIS — M25511 Pain in right shoulder: Secondary | ICD-10-CM | POA: Diagnosis not present

## 2016-07-04 DIAGNOSIS — M5412 Radiculopathy, cervical region: Secondary | ICD-10-CM | POA: Diagnosis not present

## 2016-07-04 DIAGNOSIS — R200 Anesthesia of skin: Secondary | ICD-10-CM | POA: Diagnosis not present

## 2016-07-07 DIAGNOSIS — E0800 Diabetes mellitus due to underlying condition with hyperosmolarity without nonketotic hyperglycemic-hyperosmolar coma (NKHHC): Secondary | ICD-10-CM | POA: Diagnosis not present

## 2016-07-07 DIAGNOSIS — I110 Hypertensive heart disease with heart failure: Secondary | ICD-10-CM | POA: Diagnosis not present

## 2016-07-07 DIAGNOSIS — E785 Hyperlipidemia, unspecified: Secondary | ICD-10-CM | POA: Diagnosis not present

## 2016-07-07 DIAGNOSIS — E7889 Other lipoprotein metabolism disorders: Secondary | ICD-10-CM | POA: Diagnosis not present

## 2016-07-11 DIAGNOSIS — G8929 Other chronic pain: Secondary | ICD-10-CM | POA: Diagnosis not present

## 2016-07-11 DIAGNOSIS — M25511 Pain in right shoulder: Secondary | ICD-10-CM | POA: Diagnosis not present

## 2016-07-11 DIAGNOSIS — R202 Paresthesia of skin: Secondary | ICD-10-CM | POA: Diagnosis not present

## 2016-07-11 DIAGNOSIS — M25612 Stiffness of left shoulder, not elsewhere classified: Secondary | ICD-10-CM | POA: Diagnosis not present

## 2016-07-11 DIAGNOSIS — M5412 Radiculopathy, cervical region: Secondary | ICD-10-CM | POA: Diagnosis not present

## 2016-07-11 DIAGNOSIS — R200 Anesthesia of skin: Secondary | ICD-10-CM | POA: Diagnosis not present

## 2016-07-11 DIAGNOSIS — M25512 Pain in left shoulder: Secondary | ICD-10-CM | POA: Diagnosis not present

## 2016-08-06 DIAGNOSIS — E089 Diabetes mellitus due to underlying condition without complications: Secondary | ICD-10-CM | POA: Diagnosis not present

## 2016-08-06 DIAGNOSIS — I110 Hypertensive heart disease with heart failure: Secondary | ICD-10-CM | POA: Diagnosis not present

## 2016-08-12 DIAGNOSIS — Z7984 Long term (current) use of oral hypoglycemic drugs: Secondary | ICD-10-CM | POA: Diagnosis not present

## 2016-08-12 DIAGNOSIS — M5010 Cervical disc disorder with radiculopathy, unspecified cervical region: Secondary | ICD-10-CM | POA: Diagnosis not present

## 2016-08-12 DIAGNOSIS — M5412 Radiculopathy, cervical region: Secondary | ICD-10-CM | POA: Diagnosis not present

## 2016-08-12 DIAGNOSIS — Z7982 Long term (current) use of aspirin: Secondary | ICD-10-CM | POA: Diagnosis not present

## 2016-08-12 DIAGNOSIS — M5030 Other cervical disc degeneration, unspecified cervical region: Secondary | ICD-10-CM | POA: Insufficient documentation

## 2016-08-12 DIAGNOSIS — I10 Essential (primary) hypertension: Secondary | ICD-10-CM | POA: Diagnosis not present

## 2016-08-12 DIAGNOSIS — Z79899 Other long term (current) drug therapy: Secondary | ICD-10-CM | POA: Diagnosis not present

## 2016-08-12 DIAGNOSIS — E119 Type 2 diabetes mellitus without complications: Secondary | ICD-10-CM | POA: Diagnosis not present

## 2016-08-15 DIAGNOSIS — B351 Tinea unguium: Secondary | ICD-10-CM | POA: Diagnosis not present

## 2016-08-20 DIAGNOSIS — B965 Pseudomonas (aeruginosa) (mallei) (pseudomallei) as the cause of diseases classified elsewhere: Secondary | ICD-10-CM | POA: Diagnosis not present

## 2016-08-20 DIAGNOSIS — L811 Chloasma: Secondary | ICD-10-CM | POA: Diagnosis not present

## 2016-09-09 DIAGNOSIS — Z23 Encounter for immunization: Secondary | ICD-10-CM | POA: Diagnosis not present

## 2016-09-11 DIAGNOSIS — Z23 Encounter for immunization: Secondary | ICD-10-CM | POA: Diagnosis not present

## 2016-10-02 DIAGNOSIS — G8929 Other chronic pain: Secondary | ICD-10-CM | POA: Diagnosis not present

## 2016-10-02 DIAGNOSIS — M25512 Pain in left shoulder: Secondary | ICD-10-CM | POA: Diagnosis not present

## 2016-10-22 DIAGNOSIS — E7800 Pure hypercholesterolemia, unspecified: Secondary | ICD-10-CM | POA: Diagnosis not present

## 2016-10-22 DIAGNOSIS — L638 Other alopecia areata: Secondary | ICD-10-CM | POA: Diagnosis not present

## 2016-10-22 DIAGNOSIS — E782 Mixed hyperlipidemia: Secondary | ICD-10-CM | POA: Diagnosis not present

## 2016-10-22 DIAGNOSIS — E119 Type 2 diabetes mellitus without complications: Secondary | ICD-10-CM | POA: Diagnosis not present

## 2016-10-22 DIAGNOSIS — Z6832 Body mass index (BMI) 32.0-32.9, adult: Secondary | ICD-10-CM | POA: Diagnosis not present

## 2016-10-27 DIAGNOSIS — H43392 Other vitreous opacities, left eye: Secondary | ICD-10-CM | POA: Diagnosis not present

## 2016-10-27 DIAGNOSIS — H40023 Open angle with borderline findings, high risk, bilateral: Secondary | ICD-10-CM | POA: Diagnosis not present

## 2016-11-05 DIAGNOSIS — M8580 Other specified disorders of bone density and structure, unspecified site: Secondary | ICD-10-CM | POA: Diagnosis not present

## 2016-11-05 DIAGNOSIS — Z01419 Encounter for gynecological examination (general) (routine) without abnormal findings: Secondary | ICD-10-CM | POA: Diagnosis not present

## 2016-11-05 DIAGNOSIS — Z1231 Encounter for screening mammogram for malignant neoplasm of breast: Secondary | ICD-10-CM | POA: Diagnosis not present

## 2016-11-05 DIAGNOSIS — M8589 Other specified disorders of bone density and structure, multiple sites: Secondary | ICD-10-CM | POA: Diagnosis not present

## 2016-11-10 DIAGNOSIS — R202 Paresthesia of skin: Secondary | ICD-10-CM | POA: Diagnosis not present

## 2016-11-10 DIAGNOSIS — E0800 Diabetes mellitus due to underlying condition with hyperosmolarity without nonketotic hyperglycemic-hyperosmolar coma (NKHHC): Secondary | ICD-10-CM | POA: Diagnosis not present

## 2016-11-10 DIAGNOSIS — E785 Hyperlipidemia, unspecified: Secondary | ICD-10-CM | POA: Diagnosis not present

## 2016-11-10 DIAGNOSIS — I10 Essential (primary) hypertension: Secondary | ICD-10-CM | POA: Diagnosis not present

## 2016-11-12 DIAGNOSIS — R202 Paresthesia of skin: Secondary | ICD-10-CM | POA: Diagnosis not present

## 2016-11-12 DIAGNOSIS — R200 Anesthesia of skin: Secondary | ICD-10-CM | POA: Diagnosis not present

## 2017-04-07 DIAGNOSIS — G8929 Other chronic pain: Secondary | ICD-10-CM | POA: Diagnosis not present

## 2017-04-07 DIAGNOSIS — M1711 Unilateral primary osteoarthritis, right knee: Secondary | ICD-10-CM | POA: Diagnosis not present

## 2017-04-16 DIAGNOSIS — R200 Anesthesia of skin: Secondary | ICD-10-CM | POA: Diagnosis not present

## 2017-04-16 DIAGNOSIS — Z0000 Encounter for general adult medical examination without abnormal findings: Secondary | ICD-10-CM | POA: Diagnosis not present

## 2017-04-16 DIAGNOSIS — R202 Paresthesia of skin: Secondary | ICD-10-CM | POA: Diagnosis not present

## 2017-04-20 DIAGNOSIS — H04123 Dry eye syndrome of bilateral lacrimal glands: Secondary | ICD-10-CM | POA: Diagnosis not present

## 2017-04-20 DIAGNOSIS — H40023 Open angle with borderline findings, high risk, bilateral: Secondary | ICD-10-CM | POA: Diagnosis not present

## 2017-04-20 DIAGNOSIS — Z961 Presence of intraocular lens: Secondary | ICD-10-CM | POA: Diagnosis not present

## 2017-04-20 DIAGNOSIS — H35033 Hypertensive retinopathy, bilateral: Secondary | ICD-10-CM | POA: Diagnosis not present

## 2017-05-12 DIAGNOSIS — R7309 Other abnormal glucose: Secondary | ICD-10-CM | POA: Diagnosis not present

## 2017-05-12 DIAGNOSIS — I10 Essential (primary) hypertension: Secondary | ICD-10-CM | POA: Diagnosis not present

## 2017-05-12 DIAGNOSIS — E119 Type 2 diabetes mellitus without complications: Secondary | ICD-10-CM | POA: Diagnosis not present

## 2017-05-12 DIAGNOSIS — E785 Hyperlipidemia, unspecified: Secondary | ICD-10-CM | POA: Diagnosis not present

## 2017-05-13 DIAGNOSIS — N390 Urinary tract infection, site not specified: Secondary | ICD-10-CM | POA: Diagnosis not present

## 2017-06-11 DIAGNOSIS — M25612 Stiffness of left shoulder, not elsewhere classified: Secondary | ICD-10-CM | POA: Diagnosis not present

## 2017-06-11 DIAGNOSIS — T8484XA Pain due to internal orthopedic prosthetic devices, implants and grafts, initial encounter: Secondary | ICD-10-CM | POA: Diagnosis not present

## 2017-06-11 DIAGNOSIS — S42202S Unspecified fracture of upper end of left humerus, sequela: Secondary | ICD-10-CM | POA: Diagnosis not present

## 2017-06-18 DIAGNOSIS — M19012 Primary osteoarthritis, left shoulder: Secondary | ICD-10-CM | POA: Diagnosis not present

## 2017-06-18 DIAGNOSIS — M25612 Stiffness of left shoulder, not elsewhere classified: Secondary | ICD-10-CM | POA: Diagnosis not present

## 2017-06-18 DIAGNOSIS — T8484XA Pain due to internal orthopedic prosthetic devices, implants and grafts, initial encounter: Secondary | ICD-10-CM | POA: Diagnosis not present

## 2017-06-18 DIAGNOSIS — Z96612 Presence of left artificial shoulder joint: Secondary | ICD-10-CM | POA: Diagnosis not present

## 2017-06-18 DIAGNOSIS — S42202S Unspecified fracture of upper end of left humerus, sequela: Secondary | ICD-10-CM | POA: Diagnosis not present

## 2017-07-04 DIAGNOSIS — F411 Generalized anxiety disorder: Secondary | ICD-10-CM | POA: Diagnosis not present

## 2017-07-04 DIAGNOSIS — E118 Type 2 diabetes mellitus with unspecified complications: Secondary | ICD-10-CM | POA: Diagnosis not present

## 2017-07-04 DIAGNOSIS — I10 Essential (primary) hypertension: Secondary | ICD-10-CM | POA: Diagnosis not present

## 2017-07-04 DIAGNOSIS — E039 Hypothyroidism, unspecified: Secondary | ICD-10-CM | POA: Diagnosis not present

## 2017-07-04 IMAGING — CT CT HEAD W/O CM
2 series · 16 of 30 positions shown, 20 images · non-contrast
Comparison: None.

CLINICAL DATA: Intermittent left temporal headache, onset this evening. Mild
nausea.

EXAM:
CT HEAD WITHOUT CONTRAST
TECHNIQUE: Contiguous axial images were obtained from the base of the skull
through the vertex without intravenous contrast.

[Series 201: head w/o, idose (1) · axial · non-contrast · 0.49mm/px · z∈[+71,+191]mm · 13 of 30 slices shown, 17 images]
[im 3/30  brain]
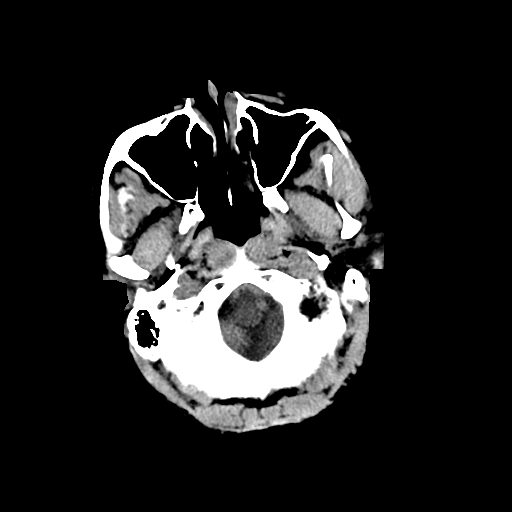
[im 3/30  bone]
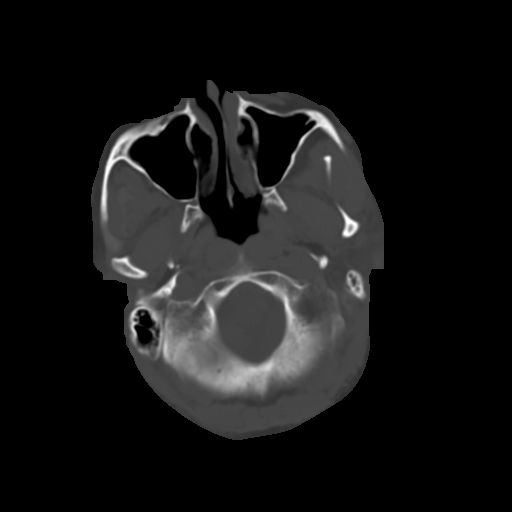
[im 5/30  brain]
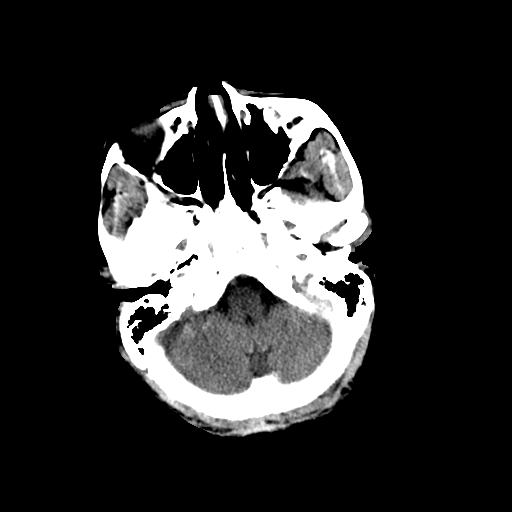
[im 7/30  brain]
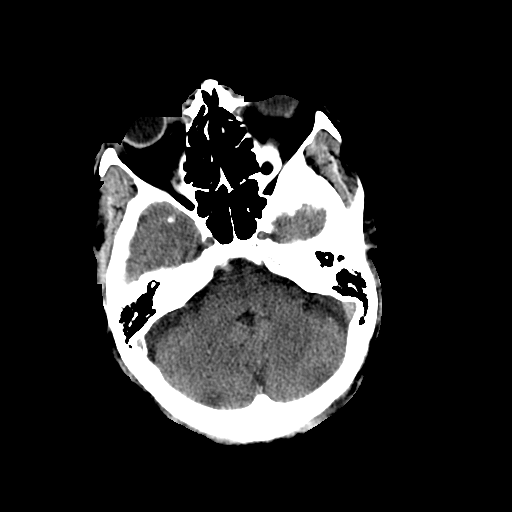
[im 9/30  brain]
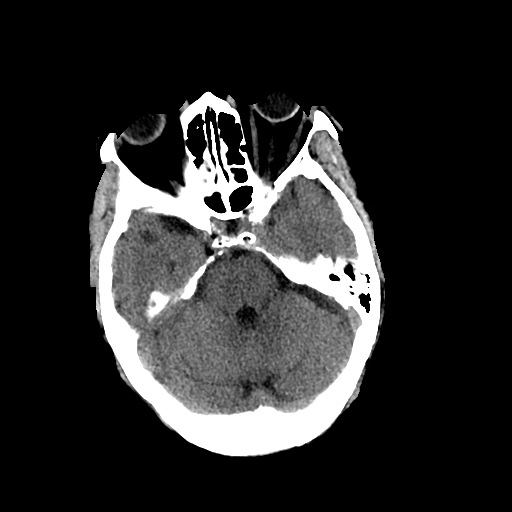
[im 11/30  brain]
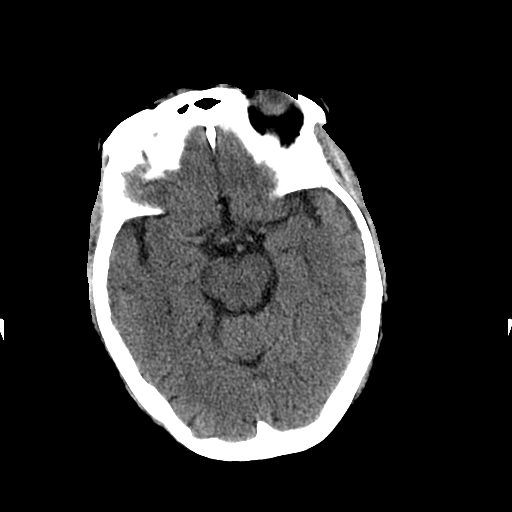
[im 11/30  bone]
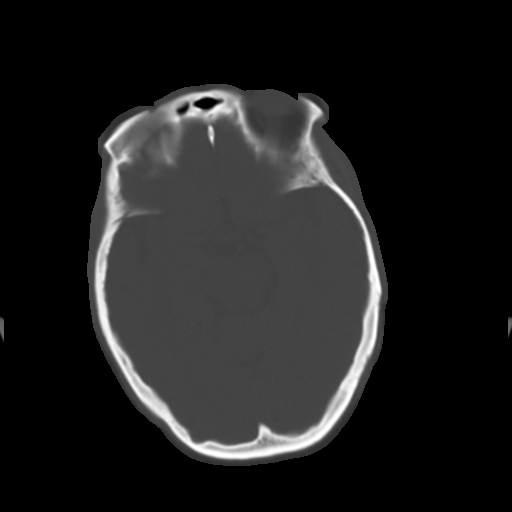
[im 13/30  brain]
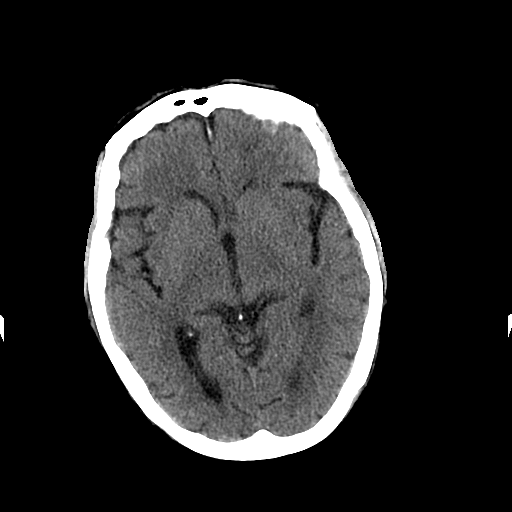
[im 15/30  brain]
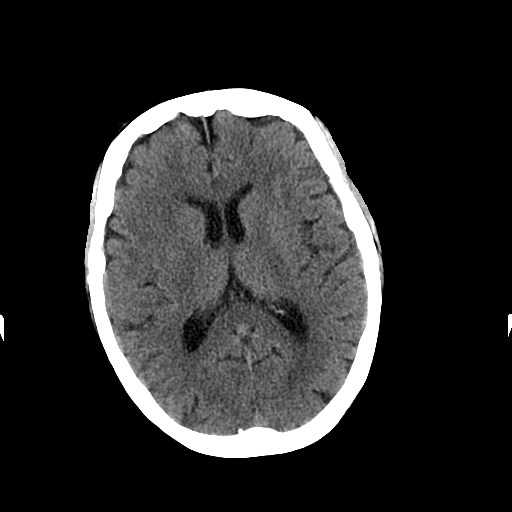
[im 17/30  brain]
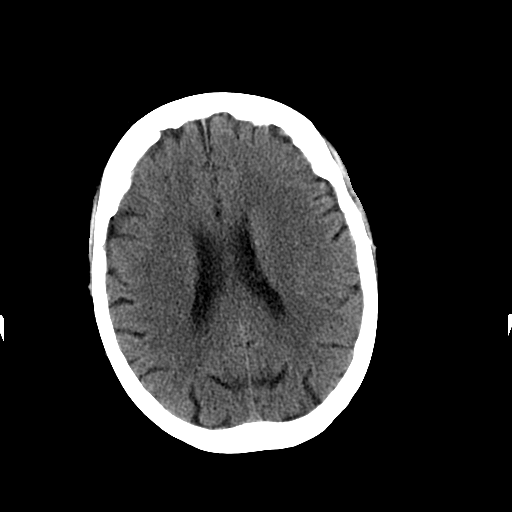
[im 19/30  brain]
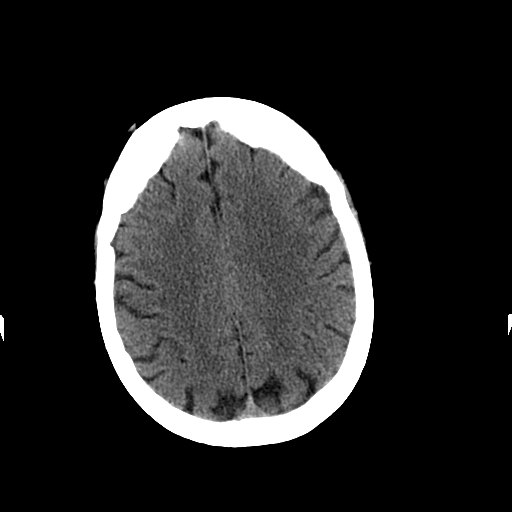
[im 19/30  bone]
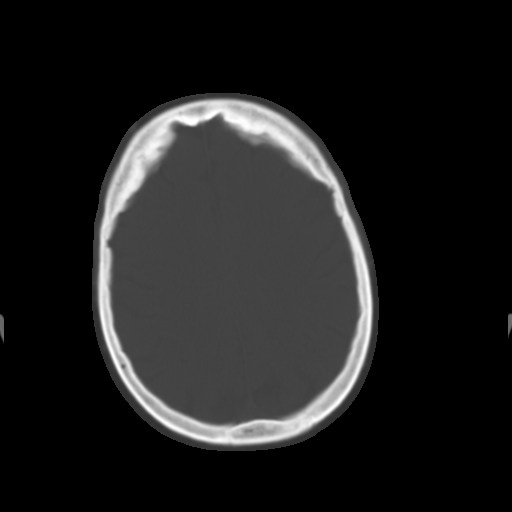
[im 21/30  brain]
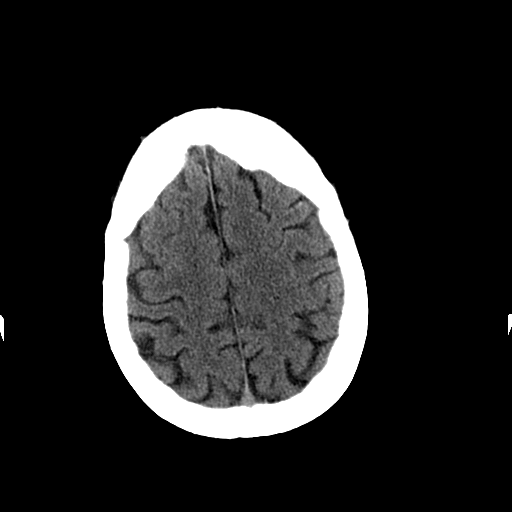
[im 23/30  brain]
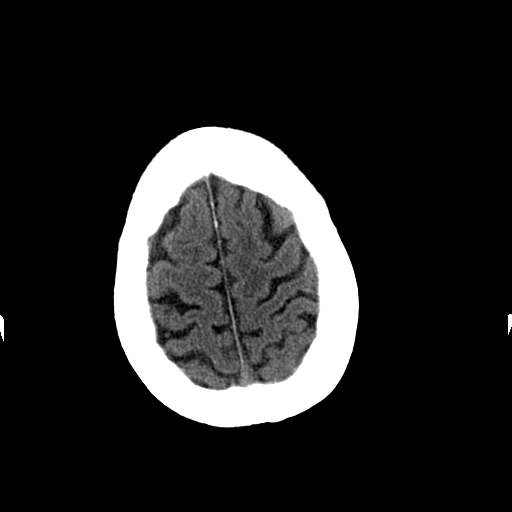
[im 25/30  brain]
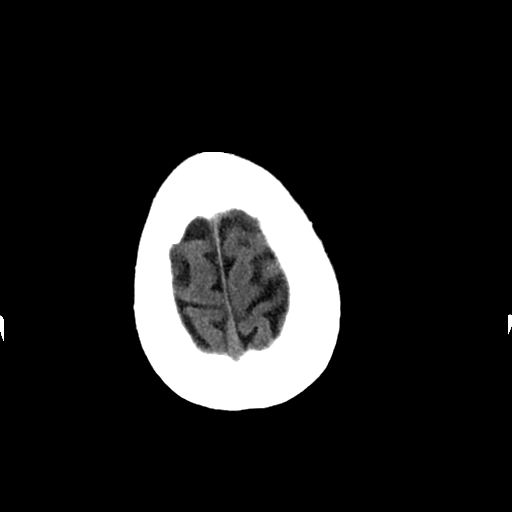
[im 27/30  brain]
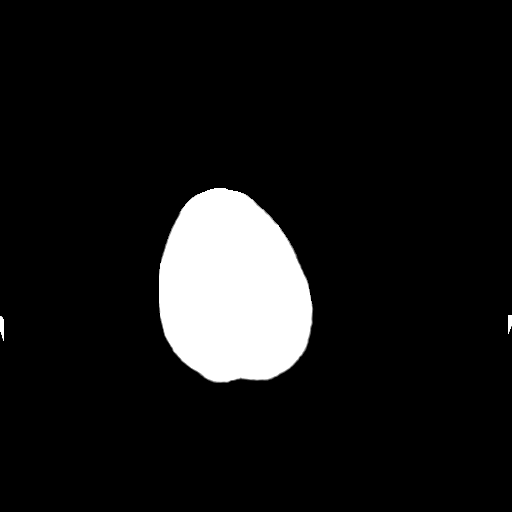
[im 27/30  bone]
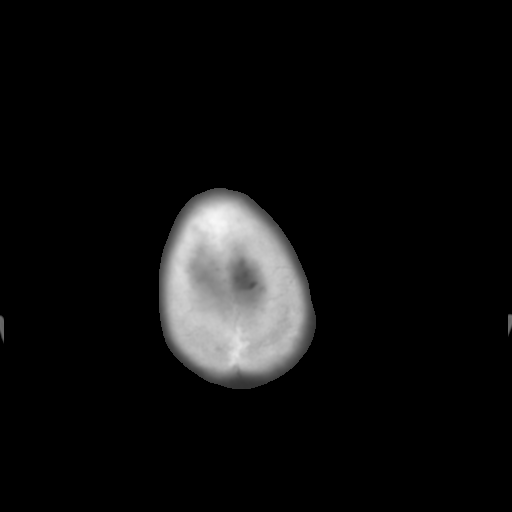

[Series 202: head w/o bone, idose (1) · axial · non-contrast · 0.49mm/px · z∈[+71,+111]mm · 3 of 30 slices shown]
[im 3/30  bone]
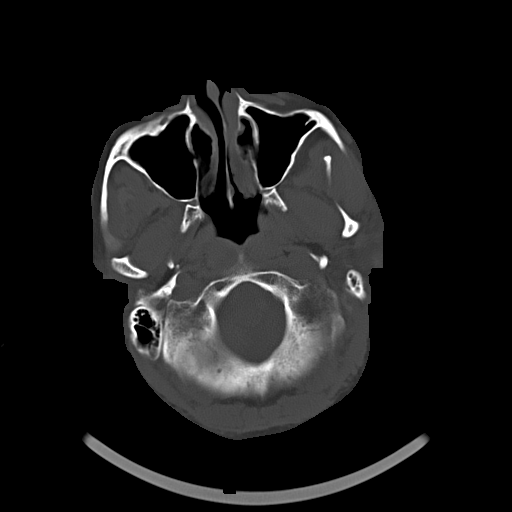
[im 7/30  bone]
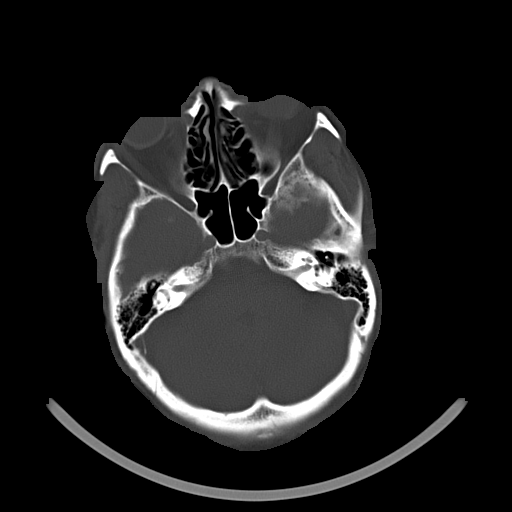
[im 11/30  bone]
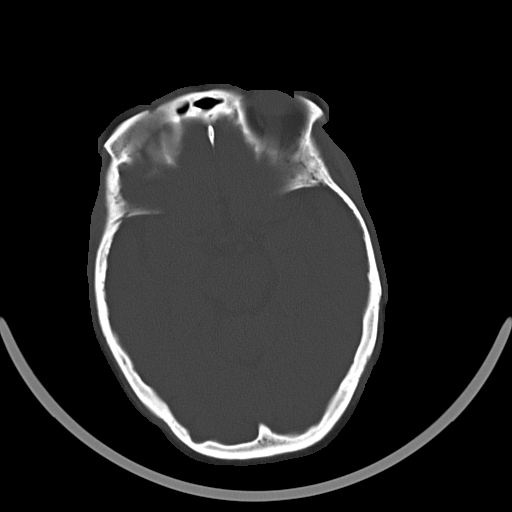

[16 of 30 positions shown; findings below may reference images not displayed]

FINDINGS: There is no intracranial hemorrhage, mass or evidence of acute
infarction. There is no extra-axial fluid collection. Gray matter
and white matter appear normal. Cerebral volume is normal for age.
Brainstem and posterior fossa are unremarkable. The CSF spaces
appear normal.

The bony structures are intact. The visible portions of the
paranasal sinuses are clear.
IMPRESSION: Normal brain

## 2017-07-04 IMAGING — DX DG CHEST 2V
2 series · 2 of 2 positions shown · non-contrast
Comparison: 05/20/2011

CLINICAL DATA: Headache and left-sided jaw pain. Mild dyspnea.
Onset tonight.

EXAM:
CHEST  2 VIEW

[chest pa]
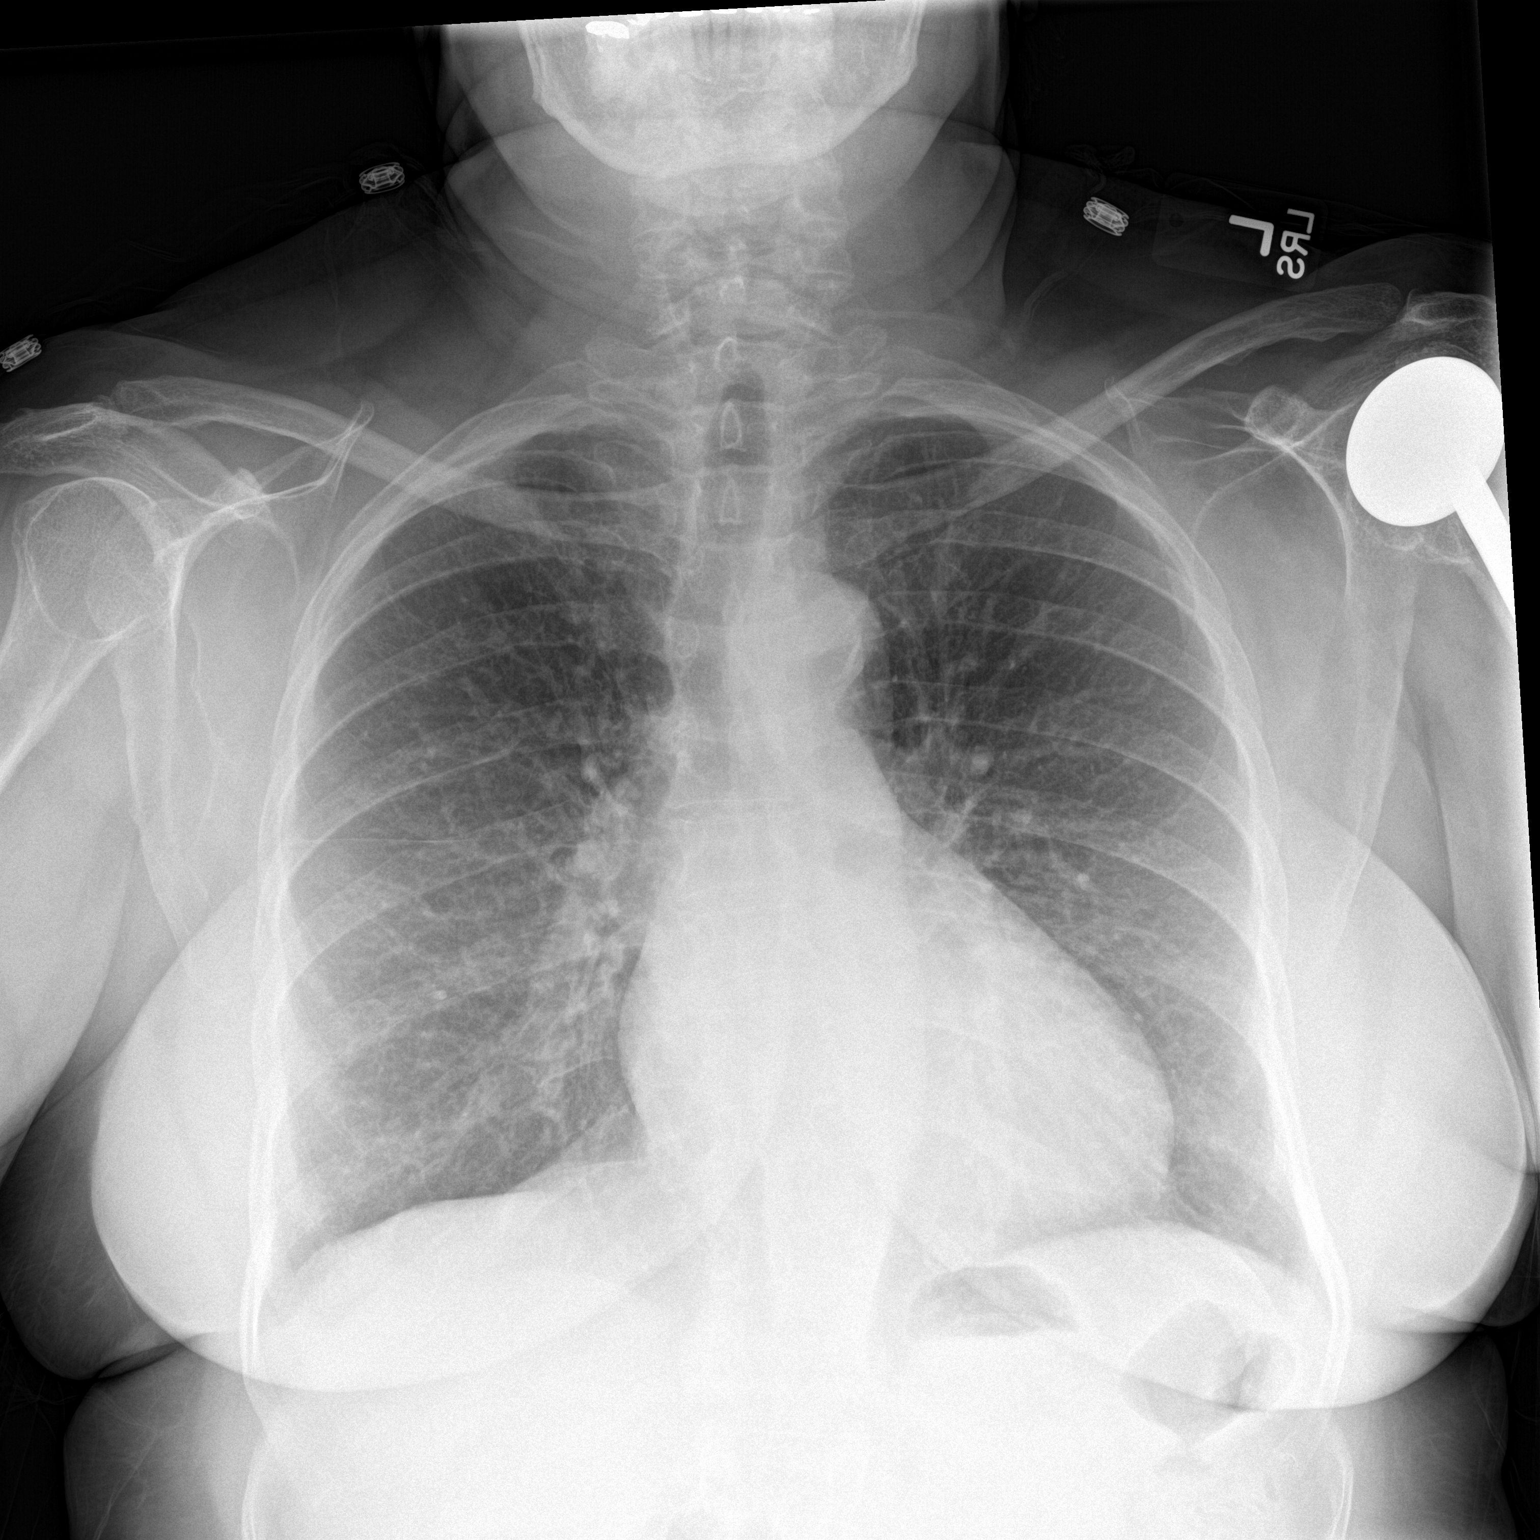

[chest lat]
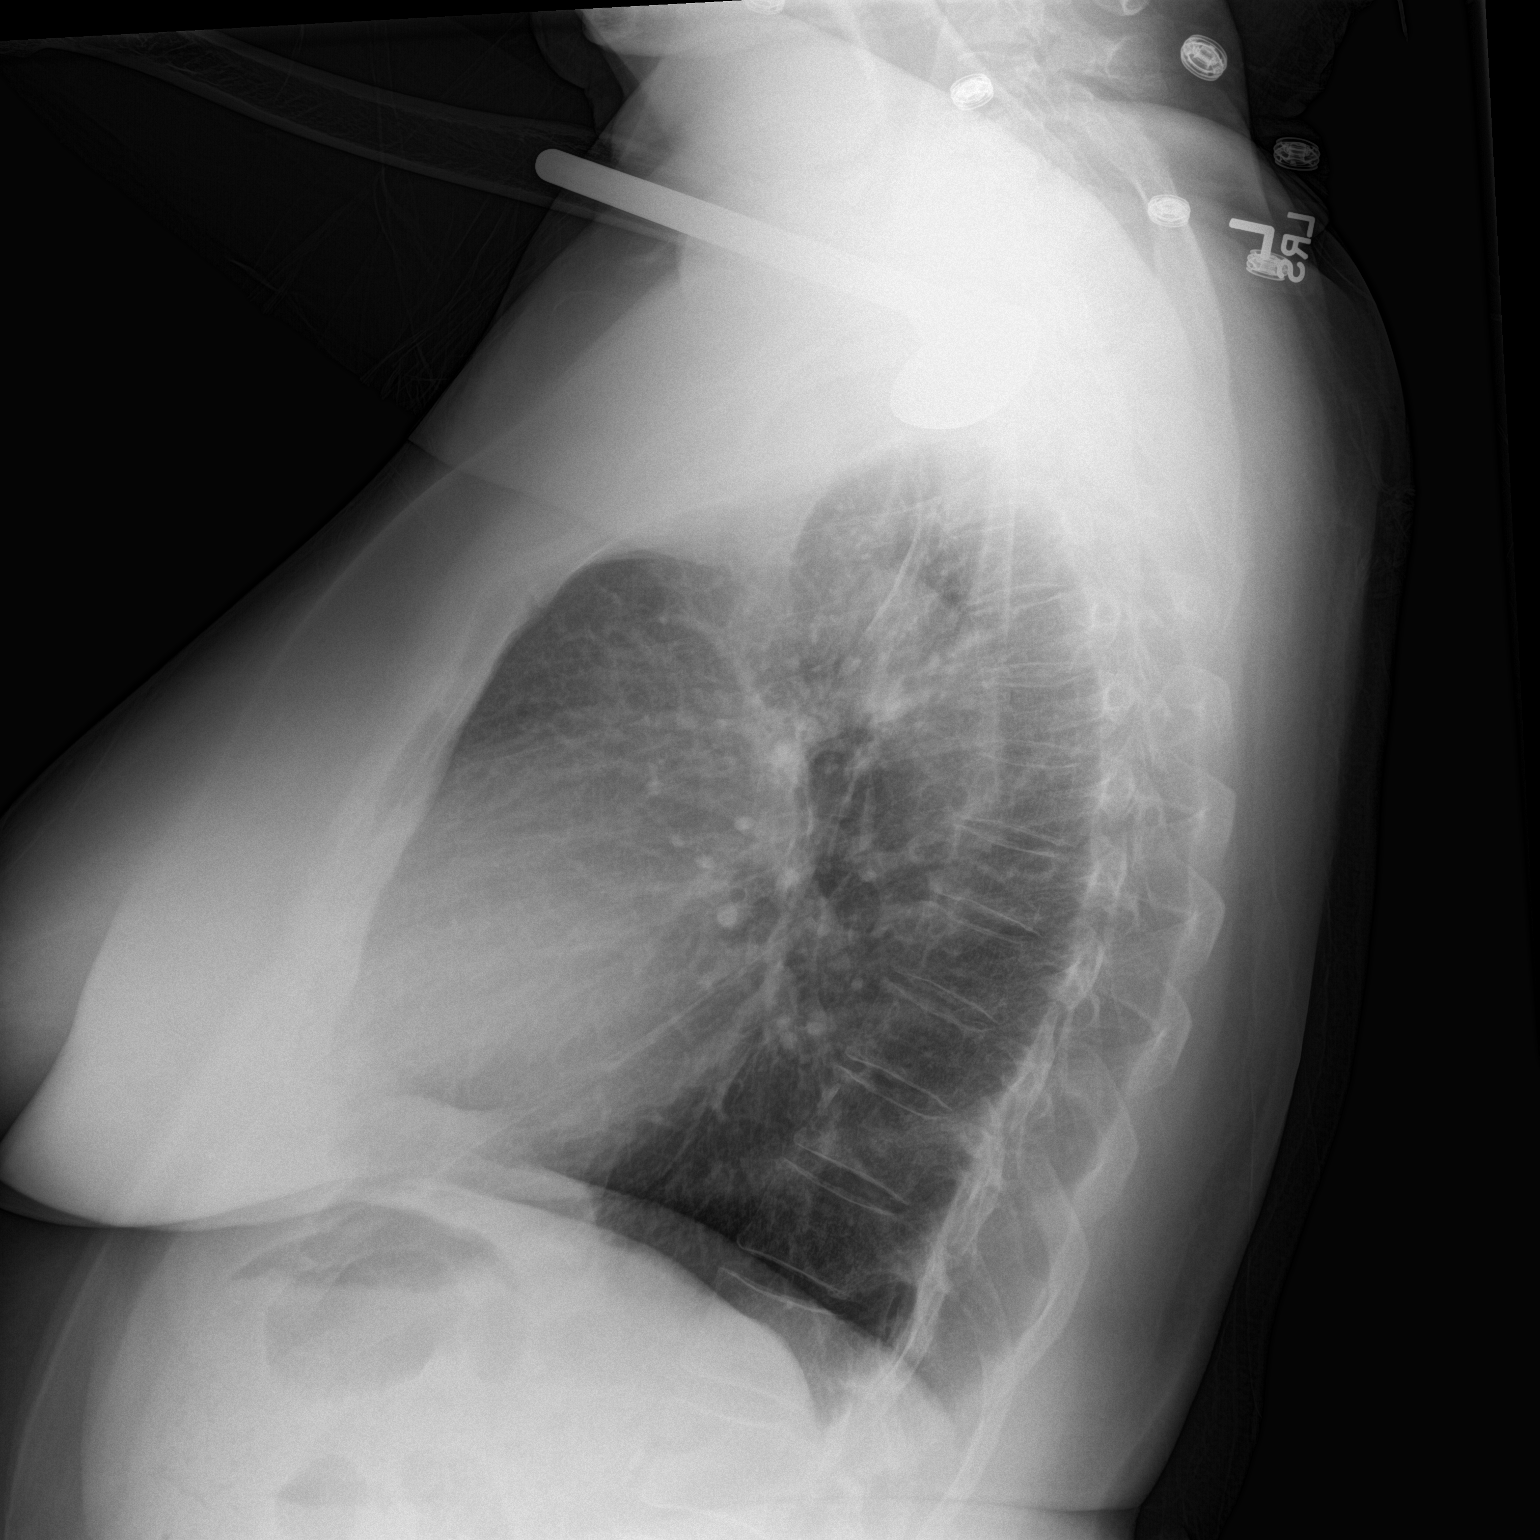

[2 of 2 positions shown; findings below may reference images not displayed]

FINDINGS: The heart size and mediastinal contours are within normal limits.
Both lungs are clear. The visualized skeletal structures are
unremarkable.
IMPRESSION: No active cardiopulmonary disease.

## 2017-07-23 DIAGNOSIS — M25612 Stiffness of left shoulder, not elsewhere classified: Secondary | ICD-10-CM | POA: Diagnosis not present

## 2017-07-23 DIAGNOSIS — T8484XA Pain due to internal orthopedic prosthetic devices, implants and grafts, initial encounter: Secondary | ICD-10-CM | POA: Diagnosis not present

## 2017-07-23 DIAGNOSIS — S42202S Unspecified fracture of upper end of left humerus, sequela: Secondary | ICD-10-CM | POA: Diagnosis not present

## 2017-07-23 DIAGNOSIS — Z96612 Presence of left artificial shoulder joint: Secondary | ICD-10-CM | POA: Diagnosis not present

## 2017-07-23 DIAGNOSIS — T8484XS Pain due to internal orthopedic prosthetic devices, implants and grafts, sequela: Secondary | ICD-10-CM | POA: Diagnosis not present

## 2017-07-23 DIAGNOSIS — M75102 Unspecified rotator cuff tear or rupture of left shoulder, not specified as traumatic: Secondary | ICD-10-CM | POA: Diagnosis not present

## 2017-09-15 DIAGNOSIS — I10 Essential (primary) hypertension: Secondary | ICD-10-CM | POA: Diagnosis not present

## 2017-09-15 DIAGNOSIS — Z6833 Body mass index (BMI) 33.0-33.9, adult: Secondary | ICD-10-CM | POA: Diagnosis not present

## 2017-09-15 DIAGNOSIS — E119 Type 2 diabetes mellitus without complications: Secondary | ICD-10-CM | POA: Diagnosis not present

## 2017-09-15 DIAGNOSIS — E782 Mixed hyperlipidemia: Secondary | ICD-10-CM | POA: Diagnosis not present

## 2017-09-15 DIAGNOSIS — M130 Polyarthritis, unspecified: Secondary | ICD-10-CM | POA: Diagnosis not present

## 2017-09-16 DIAGNOSIS — Z23 Encounter for immunization: Secondary | ICD-10-CM | POA: Diagnosis not present

## 2017-11-23 DIAGNOSIS — Z1231 Encounter for screening mammogram for malignant neoplasm of breast: Secondary | ICD-10-CM | POA: Diagnosis not present

## 2017-11-23 DIAGNOSIS — Z6835 Body mass index (BMI) 35.0-35.9, adult: Secondary | ICD-10-CM | POA: Diagnosis not present

## 2017-11-23 DIAGNOSIS — Z124 Encounter for screening for malignant neoplasm of cervix: Secondary | ICD-10-CM | POA: Diagnosis not present

## 2017-11-23 DIAGNOSIS — N898 Other specified noninflammatory disorders of vagina: Secondary | ICD-10-CM | POA: Diagnosis not present

## 2017-11-27 DIAGNOSIS — J069 Acute upper respiratory infection, unspecified: Secondary | ICD-10-CM | POA: Diagnosis not present

## 2017-11-27 DIAGNOSIS — I10 Essential (primary) hypertension: Secondary | ICD-10-CM | POA: Diagnosis not present

## 2017-11-27 DIAGNOSIS — E785 Hyperlipidemia, unspecified: Secondary | ICD-10-CM | POA: Diagnosis not present

## 2018-03-18 DIAGNOSIS — J309 Allergic rhinitis, unspecified: Secondary | ICD-10-CM | POA: Diagnosis not present

## 2018-06-21 DIAGNOSIS — H35373 Puckering of macula, bilateral: Secondary | ICD-10-CM | POA: Diagnosis not present

## 2018-06-21 DIAGNOSIS — Z961 Presence of intraocular lens: Secondary | ICD-10-CM | POA: Diagnosis not present

## 2018-06-21 DIAGNOSIS — H40023 Open angle with borderline findings, high risk, bilateral: Secondary | ICD-10-CM | POA: Diagnosis not present

## 2018-06-21 DIAGNOSIS — H35033 Hypertensive retinopathy, bilateral: Secondary | ICD-10-CM | POA: Diagnosis not present

## 2018-07-13 DIAGNOSIS — N390 Urinary tract infection, site not specified: Secondary | ICD-10-CM | POA: Diagnosis not present

## 2018-07-13 DIAGNOSIS — E1169 Type 2 diabetes mellitus with other specified complication: Secondary | ICD-10-CM | POA: Diagnosis not present

## 2018-07-13 DIAGNOSIS — I509 Heart failure, unspecified: Secondary | ICD-10-CM | POA: Diagnosis not present

## 2018-07-13 DIAGNOSIS — E789 Disorder of lipoprotein metabolism, unspecified: Secondary | ICD-10-CM | POA: Diagnosis not present

## 2018-07-13 DIAGNOSIS — I119 Hypertensive heart disease without heart failure: Secondary | ICD-10-CM | POA: Diagnosis not present

## 2018-07-13 DIAGNOSIS — D529 Folate deficiency anemia, unspecified: Secondary | ICD-10-CM | POA: Diagnosis not present

## 2018-07-13 DIAGNOSIS — I10 Essential (primary) hypertension: Secondary | ICD-10-CM | POA: Diagnosis not present

## 2018-07-29 DIAGNOSIS — S1086XA Insect bite of other specified part of neck, initial encounter: Secondary | ICD-10-CM | POA: Diagnosis not present

## 2018-07-29 DIAGNOSIS — L239 Allergic contact dermatitis, unspecified cause: Secondary | ICD-10-CM | POA: Diagnosis not present

## 2018-08-03 DIAGNOSIS — I10 Essential (primary) hypertension: Secondary | ICD-10-CM | POA: Diagnosis not present

## 2018-08-03 DIAGNOSIS — E785 Hyperlipidemia, unspecified: Secondary | ICD-10-CM | POA: Diagnosis not present

## 2018-08-03 DIAGNOSIS — E1169 Type 2 diabetes mellitus with other specified complication: Secondary | ICD-10-CM | POA: Diagnosis not present

## 2018-08-03 DIAGNOSIS — N390 Urinary tract infection, site not specified: Secondary | ICD-10-CM | POA: Diagnosis not present

## 2018-08-05 ENCOUNTER — Other Ambulatory Visit: Payer: Self-pay

## 2018-08-24 DIAGNOSIS — Z0000 Encounter for general adult medical examination without abnormal findings: Secondary | ICD-10-CM | POA: Diagnosis not present

## 2018-09-17 DIAGNOSIS — Z23 Encounter for immunization: Secondary | ICD-10-CM | POA: Diagnosis not present

## 2018-10-05 DIAGNOSIS — R001 Bradycardia, unspecified: Secondary | ICD-10-CM | POA: Diagnosis not present

## 2018-10-05 DIAGNOSIS — R079 Chest pain, unspecified: Secondary | ICD-10-CM | POA: Diagnosis not present

## 2018-10-05 DIAGNOSIS — R072 Precordial pain: Secondary | ICD-10-CM | POA: Diagnosis not present

## 2018-10-05 DIAGNOSIS — R110 Nausea: Secondary | ICD-10-CM | POA: Diagnosis not present

## 2018-10-06 DIAGNOSIS — R001 Bradycardia, unspecified: Secondary | ICD-10-CM | POA: Diagnosis not present

## 2018-12-06 DIAGNOSIS — E785 Hyperlipidemia, unspecified: Secondary | ICD-10-CM | POA: Diagnosis not present

## 2018-12-06 DIAGNOSIS — Z6835 Body mass index (BMI) 35.0-35.9, adult: Secondary | ICD-10-CM | POA: Diagnosis not present

## 2018-12-06 DIAGNOSIS — I119 Hypertensive heart disease without heart failure: Secondary | ICD-10-CM | POA: Diagnosis not present

## 2018-12-06 DIAGNOSIS — E1169 Type 2 diabetes mellitus with other specified complication: Secondary | ICD-10-CM | POA: Diagnosis not present

## 2018-12-06 DIAGNOSIS — F064 Anxiety disorder due to known physiological condition: Secondary | ICD-10-CM | POA: Diagnosis not present

## 2018-12-13 DIAGNOSIS — L820 Inflamed seborrheic keratosis: Secondary | ICD-10-CM | POA: Diagnosis not present

## 2018-12-21 DIAGNOSIS — G8929 Other chronic pain: Secondary | ICD-10-CM | POA: Diagnosis not present

## 2018-12-21 DIAGNOSIS — Z96612 Presence of left artificial shoulder joint: Secondary | ICD-10-CM | POA: Diagnosis not present

## 2018-12-21 DIAGNOSIS — M25512 Pain in left shoulder: Secondary | ICD-10-CM | POA: Diagnosis not present

## 2019-03-08 DIAGNOSIS — R7303 Prediabetes: Secondary | ICD-10-CM | POA: Diagnosis not present

## 2019-03-08 DIAGNOSIS — E782 Mixed hyperlipidemia: Secondary | ICD-10-CM | POA: Diagnosis not present

## 2019-03-08 DIAGNOSIS — I10 Essential (primary) hypertension: Secondary | ICD-10-CM | POA: Diagnosis not present

## 2019-03-11 DIAGNOSIS — E1169 Type 2 diabetes mellitus with other specified complication: Secondary | ICD-10-CM | POA: Diagnosis not present

## 2019-03-11 DIAGNOSIS — E782 Mixed hyperlipidemia: Secondary | ICD-10-CM | POA: Diagnosis not present

## 2019-03-11 DIAGNOSIS — E038 Other specified hypothyroidism: Secondary | ICD-10-CM | POA: Diagnosis not present

## 2019-03-11 DIAGNOSIS — I10 Essential (primary) hypertension: Secondary | ICD-10-CM | POA: Diagnosis not present

## 2019-04-11 DIAGNOSIS — Z01419 Encounter for gynecological examination (general) (routine) without abnormal findings: Secondary | ICD-10-CM | POA: Diagnosis not present

## 2019-04-11 DIAGNOSIS — M8580 Other specified disorders of bone density and structure, unspecified site: Secondary | ICD-10-CM | POA: Diagnosis not present

## 2019-04-11 DIAGNOSIS — Z1231 Encounter for screening mammogram for malignant neoplasm of breast: Secondary | ICD-10-CM | POA: Diagnosis not present

## 2019-04-11 DIAGNOSIS — Z13820 Encounter for screening for osteoporosis: Secondary | ICD-10-CM | POA: Diagnosis not present

## 2019-04-21 DIAGNOSIS — S70362A Insect bite (nonvenomous), left thigh, initial encounter: Secondary | ICD-10-CM | POA: Diagnosis not present

## 2019-05-10 DIAGNOSIS — M1711 Unilateral primary osteoarthritis, right knee: Secondary | ICD-10-CM | POA: Diagnosis not present

## 2019-05-25 DIAGNOSIS — R209 Unspecified disturbances of skin sensation: Secondary | ICD-10-CM | POA: Diagnosis not present

## 2019-05-25 DIAGNOSIS — E1169 Type 2 diabetes mellitus with other specified complication: Secondary | ICD-10-CM | POA: Diagnosis not present

## 2019-05-25 DIAGNOSIS — I10 Essential (primary) hypertension: Secondary | ICD-10-CM | POA: Diagnosis not present

## 2019-05-27 ENCOUNTER — Other Ambulatory Visit (HOSPITAL_COMMUNITY): Payer: Self-pay | Admitting: Family Medicine

## 2019-05-27 DIAGNOSIS — R600 Localized edema: Secondary | ICD-10-CM

## 2019-05-30 ENCOUNTER — Ambulatory Visit (HOSPITAL_COMMUNITY)
Admission: RE | Admit: 2019-05-30 | Discharge: 2019-05-30 | Disposition: A | Discharge status: Home / Self Care | Payer: Medicare Other | Source: Ambulatory Visit | Attending: Family Medicine | Admitting: Family Medicine

## 2019-05-30 ENCOUNTER — Other Ambulatory Visit: Payer: Self-pay

## 2019-05-30 DIAGNOSIS — R600 Localized edema: Secondary | ICD-10-CM | POA: Diagnosis not present

## 2019-05-30 NOTE — Progress Notes (Signed)
Venous duplex       has been completed. Preliminary results can be found under CV proc through chart review. Jeb Levering, BS, RDMS, RVT    Called results to [facility]

## 2019-06-03 DIAGNOSIS — E1169 Type 2 diabetes mellitus with other specified complication: Secondary | ICD-10-CM | POA: Diagnosis not present

## 2019-06-03 DIAGNOSIS — I10 Essential (primary) hypertension: Secondary | ICD-10-CM | POA: Diagnosis not present

## 2019-06-03 DIAGNOSIS — I119 Hypertensive heart disease without heart failure: Secondary | ICD-10-CM | POA: Diagnosis not present

## 2019-06-07 DIAGNOSIS — E785 Hyperlipidemia, unspecified: Secondary | ICD-10-CM | POA: Insufficient documentation

## 2019-06-07 DIAGNOSIS — K5904 Chronic idiopathic constipation: Secondary | ICD-10-CM | POA: Insufficient documentation

## 2019-06-07 DIAGNOSIS — E1159 Type 2 diabetes mellitus with other circulatory complications: Secondary | ICD-10-CM | POA: Diagnosis not present

## 2019-06-07 DIAGNOSIS — J302 Other seasonal allergic rhinitis: Secondary | ICD-10-CM | POA: Diagnosis not present

## 2019-06-07 DIAGNOSIS — Z7689 Persons encountering health services in other specified circumstances: Secondary | ICD-10-CM | POA: Diagnosis not present

## 2019-06-07 DIAGNOSIS — I10 Essential (primary) hypertension: Secondary | ICD-10-CM | POA: Insufficient documentation

## 2019-06-07 DIAGNOSIS — E039 Hypothyroidism, unspecified: Secondary | ICD-10-CM | POA: Insufficient documentation

## 2019-06-07 DIAGNOSIS — E782 Mixed hyperlipidemia: Secondary | ICD-10-CM | POA: Diagnosis not present

## 2019-06-07 DIAGNOSIS — I6529 Occlusion and stenosis of unspecified carotid artery: Secondary | ICD-10-CM | POA: Insufficient documentation

## 2019-06-08 DIAGNOSIS — H40023 Open angle with borderline findings, high risk, bilateral: Secondary | ICD-10-CM | POA: Diagnosis not present

## 2019-06-08 DIAGNOSIS — H1045 Other chronic allergic conjunctivitis: Secondary | ICD-10-CM | POA: Diagnosis not present

## 2019-06-08 DIAGNOSIS — L03311 Cellulitis of abdominal wall: Secondary | ICD-10-CM | POA: Diagnosis not present

## 2019-06-08 DIAGNOSIS — S30861A Insect bite (nonvenomous) of abdominal wall, initial encounter: Secondary | ICD-10-CM | POA: Diagnosis not present

## 2019-06-08 DIAGNOSIS — H35373 Puckering of macula, bilateral: Secondary | ICD-10-CM | POA: Diagnosis not present

## 2019-06-08 DIAGNOSIS — Z961 Presence of intraocular lens: Secondary | ICD-10-CM | POA: Diagnosis not present

## 2019-06-14 DIAGNOSIS — M19041 Primary osteoarthritis, right hand: Secondary | ICD-10-CM | POA: Diagnosis not present

## 2019-07-18 DIAGNOSIS — I10 Essential (primary) hypertension: Secondary | ICD-10-CM | POA: Diagnosis not present

## 2019-07-19 DIAGNOSIS — R2242 Localized swelling, mass and lump, left lower limb: Secondary | ICD-10-CM | POA: Diagnosis not present

## 2019-07-19 DIAGNOSIS — Z711 Person with feared health complaint in whom no diagnosis is made: Secondary | ICD-10-CM | POA: Diagnosis not present

## 2019-09-07 DIAGNOSIS — Z23 Encounter for immunization: Secondary | ICD-10-CM | POA: Diagnosis not present

## 2019-11-14 DIAGNOSIS — H00011 Hordeolum externum right upper eyelid: Secondary | ICD-10-CM | POA: Diagnosis not present

## 2019-11-24 DIAGNOSIS — I119 Hypertensive heart disease without heart failure: Secondary | ICD-10-CM | POA: Diagnosis not present

## 2019-11-24 DIAGNOSIS — I10 Essential (primary) hypertension: Secondary | ICD-10-CM | POA: Diagnosis not present

## 2019-11-24 DIAGNOSIS — E1169 Type 2 diabetes mellitus with other specified complication: Secondary | ICD-10-CM | POA: Diagnosis not present

## 2019-11-29 DIAGNOSIS — M130 Polyarthritis, unspecified: Secondary | ICD-10-CM | POA: Diagnosis not present

## 2019-11-29 DIAGNOSIS — Z0001 Encounter for general adult medical examination with abnormal findings: Secondary | ICD-10-CM | POA: Diagnosis not present

## 2019-11-29 DIAGNOSIS — E1169 Type 2 diabetes mellitus with other specified complication: Secondary | ICD-10-CM | POA: Diagnosis not present

## 2019-11-29 DIAGNOSIS — I10 Essential (primary) hypertension: Secondary | ICD-10-CM | POA: Diagnosis not present

## 2019-12-06 ENCOUNTER — Other Ambulatory Visit: Payer: Self-pay

## 2019-12-06 ENCOUNTER — Ambulatory Visit
Admission: RE | Admit: 2019-12-06 | Discharge: 2019-12-06 | Disposition: A | Discharge status: Home / Self Care | Payer: Medicare Other | Source: Ambulatory Visit | Attending: Family Medicine | Admitting: Family Medicine

## 2019-12-06 ENCOUNTER — Other Ambulatory Visit: Payer: Self-pay | Admitting: Family Medicine

## 2019-12-06 DIAGNOSIS — M1612 Unilateral primary osteoarthritis, left hip: Secondary | ICD-10-CM | POA: Diagnosis not present

## 2019-12-06 DIAGNOSIS — R1032 Left lower quadrant pain: Secondary | ICD-10-CM

## 2019-12-06 DIAGNOSIS — R102 Pelvic and perineal pain: Secondary | ICD-10-CM | POA: Diagnosis not present

## 2019-12-06 DIAGNOSIS — M79605 Pain in left leg: Secondary | ICD-10-CM | POA: Diagnosis not present

## 2020-01-04 DIAGNOSIS — M1711 Unilateral primary osteoarthritis, right knee: Secondary | ICD-10-CM | POA: Diagnosis not present

## 2020-01-04 DIAGNOSIS — M79605 Pain in left leg: Secondary | ICD-10-CM | POA: Diagnosis not present

## 2020-01-19 DIAGNOSIS — M1711 Unilateral primary osteoarthritis, right knee: Secondary | ICD-10-CM | POA: Diagnosis not present

## 2020-02-15 DIAGNOSIS — F411 Generalized anxiety disorder: Secondary | ICD-10-CM | POA: Diagnosis not present

## 2020-02-15 DIAGNOSIS — E782 Mixed hyperlipidemia: Secondary | ICD-10-CM | POA: Diagnosis not present

## 2020-02-15 DIAGNOSIS — I10 Essential (primary) hypertension: Secondary | ICD-10-CM | POA: Diagnosis not present

## 2020-02-15 DIAGNOSIS — E1169 Type 2 diabetes mellitus with other specified complication: Secondary | ICD-10-CM | POA: Diagnosis not present

## 2020-03-08 DIAGNOSIS — M1711 Unilateral primary osteoarthritis, right knee: Secondary | ICD-10-CM | POA: Insufficient documentation

## 2020-03-21 ENCOUNTER — Ambulatory Visit: Payer: Medicare Other | Admitting: Dietician

## 2020-03-22 DIAGNOSIS — M79651 Pain in right thigh: Secondary | ICD-10-CM | POA: Diagnosis not present

## 2020-03-29 DIAGNOSIS — M130 Polyarthritis, unspecified: Secondary | ICD-10-CM | POA: Diagnosis not present

## 2020-03-29 DIAGNOSIS — E785 Hyperlipidemia, unspecified: Secondary | ICD-10-CM | POA: Diagnosis not present

## 2020-03-29 DIAGNOSIS — E1169 Type 2 diabetes mellitus with other specified complication: Secondary | ICD-10-CM | POA: Diagnosis not present

## 2020-03-29 DIAGNOSIS — F064 Anxiety disorder due to known physiological condition: Secondary | ICD-10-CM | POA: Diagnosis not present

## 2020-03-29 DIAGNOSIS — E0842 Diabetes mellitus due to underlying condition with diabetic polyneuropathy: Secondary | ICD-10-CM | POA: Diagnosis not present

## 2020-03-29 DIAGNOSIS — Z6379 Other stressful life events affecting family and household: Secondary | ICD-10-CM | POA: Diagnosis not present

## 2020-04-11 ENCOUNTER — Ambulatory Visit: Payer: Medicare Other | Admitting: Dietician

## 2020-04-13 DIAGNOSIS — M1711 Unilateral primary osteoarthritis, right knee: Secondary | ICD-10-CM | POA: Diagnosis not present

## 2020-04-13 DIAGNOSIS — M7121 Synovial cyst of popliteal space [Baker], right knee: Secondary | ICD-10-CM | POA: Diagnosis not present

## 2020-06-19 DIAGNOSIS — H1045 Other chronic allergic conjunctivitis: Secondary | ICD-10-CM | POA: Diagnosis not present

## 2020-06-19 DIAGNOSIS — H40023 Open angle with borderline findings, high risk, bilateral: Secondary | ICD-10-CM | POA: Diagnosis not present

## 2020-06-19 DIAGNOSIS — H35373 Puckering of macula, bilateral: Secondary | ICD-10-CM | POA: Diagnosis not present

## 2020-06-19 DIAGNOSIS — H35033 Hypertensive retinopathy, bilateral: Secondary | ICD-10-CM | POA: Diagnosis not present

## 2020-06-19 DIAGNOSIS — Z961 Presence of intraocular lens: Secondary | ICD-10-CM | POA: Diagnosis not present

## 2020-06-28 DIAGNOSIS — R238 Other skin changes: Secondary | ICD-10-CM | POA: Diagnosis not present

## 2020-06-29 DIAGNOSIS — E1169 Type 2 diabetes mellitus with other specified complication: Secondary | ICD-10-CM | POA: Diagnosis not present

## 2020-06-29 DIAGNOSIS — M130 Polyarthritis, unspecified: Secondary | ICD-10-CM | POA: Diagnosis not present

## 2020-06-29 DIAGNOSIS — E039 Hypothyroidism, unspecified: Secondary | ICD-10-CM | POA: Diagnosis not present

## 2020-06-29 DIAGNOSIS — E7800 Pure hypercholesterolemia, unspecified: Secondary | ICD-10-CM | POA: Diagnosis not present

## 2020-06-29 DIAGNOSIS — I10 Essential (primary) hypertension: Secondary | ICD-10-CM | POA: Diagnosis not present

## 2020-07-04 DIAGNOSIS — M1711 Unilateral primary osteoarthritis, right knee: Secondary | ICD-10-CM | POA: Diagnosis not present

## 2020-07-04 DIAGNOSIS — M25561 Pain in right knee: Secondary | ICD-10-CM | POA: Diagnosis not present

## 2020-07-20 DIAGNOSIS — R29898 Other symptoms and signs involving the musculoskeletal system: Secondary | ICD-10-CM | POA: Diagnosis not present

## 2020-07-20 DIAGNOSIS — M1711 Unilateral primary osteoarthritis, right knee: Secondary | ICD-10-CM | POA: Diagnosis not present

## 2020-07-20 DIAGNOSIS — Z789 Other specified health status: Secondary | ICD-10-CM | POA: Diagnosis not present

## 2020-07-20 DIAGNOSIS — M25561 Pain in right knee: Secondary | ICD-10-CM | POA: Diagnosis not present

## 2020-07-20 DIAGNOSIS — M79651 Pain in right thigh: Secondary | ICD-10-CM | POA: Diagnosis not present

## 2020-07-20 DIAGNOSIS — R269 Unspecified abnormalities of gait and mobility: Secondary | ICD-10-CM | POA: Diagnosis not present

## 2020-07-31 DIAGNOSIS — M19012 Primary osteoarthritis, left shoulder: Secondary | ICD-10-CM | POA: Diagnosis not present

## 2020-07-31 DIAGNOSIS — Z96612 Presence of left artificial shoulder joint: Secondary | ICD-10-CM | POA: Diagnosis not present

## 2020-08-08 DIAGNOSIS — R269 Unspecified abnormalities of gait and mobility: Secondary | ICD-10-CM | POA: Diagnosis not present

## 2020-08-08 DIAGNOSIS — Z789 Other specified health status: Secondary | ICD-10-CM | POA: Diagnosis not present

## 2020-08-08 DIAGNOSIS — M1711 Unilateral primary osteoarthritis, right knee: Secondary | ICD-10-CM | POA: Diagnosis not present

## 2020-08-08 DIAGNOSIS — M25561 Pain in right knee: Secondary | ICD-10-CM | POA: Diagnosis not present

## 2020-08-08 DIAGNOSIS — M79651 Pain in right thigh: Secondary | ICD-10-CM | POA: Diagnosis not present

## 2020-08-08 DIAGNOSIS — R29898 Other symptoms and signs involving the musculoskeletal system: Secondary | ICD-10-CM | POA: Diagnosis not present

## 2020-08-15 ENCOUNTER — Encounter: Payer: Medicare Other | Attending: Family Medicine | Admitting: Dietician

## 2020-08-15 ENCOUNTER — Encounter: Payer: Self-pay | Admitting: Dietician

## 2020-08-15 ENCOUNTER — Other Ambulatory Visit: Payer: Self-pay

## 2020-08-15 DIAGNOSIS — E119 Type 2 diabetes mellitus without complications: Secondary | ICD-10-CM | POA: Diagnosis not present

## 2020-08-15 NOTE — Progress Notes (Signed)
Diabetes Self-Management Education  Visit Type: First/Initial  Appt. Start Time: 1000 Appt. End Time: 1100  08/15/2020  [name], identified by name and date of birth, is a 87 y.o. female with a diagnosis of Diabetes: Type 2.   ASSESSMENT Pt has a prescription for a glucometer but does not use it. Pt is interested in education in how to use meter. RD provided instruction with AccuChek Guide me. Blood glucose during visit was 114 mg/dL. Pt had eaten breakfast this morning.  Pt is taking Janumet for their diabetes.  Pt reports having a shoulder issue that required a cortisone shot and a course of prednisone that resulted in their blood sugar going so high it put them in the hospital in 2019. This was the time of their onset of T2DM. Pt reports being a emotional eater whether it be stress or celebratory. Pt is concerned about their portion sizes and the fact that their weight loss has not progressed.  Pt reports doing Silver Sneakers 3-4 times a week for about an hour each time. Pt has a group of friends that they exercise with. Pt reports having low grade arthritis in their knee that bothers them a little and wants to lose a little weight to help that.  There were no vitals taken for this visit. There is no height or weight on file to calculate BMI.   Diabetes Self-Management Education - 08/15/20 1024       Visit Information   Visit Type First/Initial      Initial Visit   Diabetes Type Type 2    Are you currently following a meal plan? No    Are you taking your medications as prescribed? Yes    Date Diagnosed 2019      Health Coping   How would you rate your overall health? Good      Psychosocial Assessment   Patient Belief/Attitude about Diabetes Motivated to manage diabetes    Self-care barriers None    Self-management support Doctor's office    Other persons present Patient    Patient Concerns Weight Control;Glycemic Control;Monitoring    Special Needs None     Learning Readiness Ready    How often do you need to have someone help you when you read instructions, pamphlets, or other written materials from your doctor or pharmacy? 2 - Rarely    What is the last grade level you completed in school? Graduate School      Pre-Education Assessment   Patient understands the diabetes disease and treatment process. Needs Instruction    Patient understands incorporating nutritional management into lifestyle. Needs Instruction    Patient undertands incorporating physical activity into lifestyle. Needs Instruction    Patient understands using medications safely. Needs Instruction    Patient understands monitoring blood glucose, interpreting and using results Needs Instruction    Patient understands prevention, detection, and treatment of acute complications. Needs Instruction    Patient understands prevention, detection, and treatment of chronic complications. Needs Instruction    Patient understands how to develop strategies to address psychosocial issues. Needs Instruction    Patient understands how to develop strategies to promote health/change behavior. Needs Instruction      Complications   Last HgB A1C per patient/outside source 6.2 %    How often do you check your blood sugar? 0 times/day (not testing)   Pt has meter, but not checking   Postprandial Blood glucose range (mg/dL) 60-630    Have you had a dilated eye exam  in the past 12 months? Yes    Have you had a dental exam in the past 12 months? Yes    Are you checking your feet? No      Dietary Intake   Breakfast Cantaloupe, blueberries, Protein One bar    Lunch Salad with beef and rice    Dinner Boiled chicken, greens, salad    Beverage(s) Water, occasional diet lemonade w/ a splash of Sprite or [employer]      Exercise   Exercise Type ADL's;Moderate (swimming / aerobic walking)    How many days per week to you exercise? 4    How many minutes per day do you exercise? 60    Total minutes per  week of exercise 240      Patient Education   Previous Diabetes Education No    Disease state  Definition of diabetes, type 1 and 2, and the diagnosis of diabetes    Nutrition management  Role of diet in the treatment of diabetes and the relationship between the three main macronutrients and blood glucose level    Physical activity and exercise  Role of exercise on diabetes management, blood pressure control and cardiac health.;Helped patient identify appropriate exercises in relation to his/her diabetes, diabetes complications and other health issue.    Medications Reviewed patients medication for diabetes, action, purpose, timing of dose and side effects.    Monitoring Taught/evaluated SMBG meter.;Purpose and frequency of SMBG.;Identified appropriate SMBG and/or A1C goals.    Chronic complications Nephropathy, what it is, prevention of, the use of ACE, ARB's and early detection of through urine microalbumia.;Retinopathy and reason for yearly dilated eye exams;Relationship between chronic complications and blood glucose control    Psychosocial adjustment Role of stress on diabetes;Worked with patient to identify barriers to care and solutions;Identified and addressed patients feelings and concerns about diabetes    Personal strategies to promote health Lifestyle issues that need to be addressed for better diabetes care   Not being so hard on themselves     Individualized Goals (developed by patient)   Nutrition General guidelines for healthy choices and portions discussed    Physical Activity Exercise 3-5 times per week    Medications take my medication as prescribed    Monitoring  test my blood glucose as discussed;send in my blood glucose log as discussed   Bring meter to follow-up     Post-Education Assessment   Patient understands the diabetes disease and treatment process. Needs Review    Patient understands incorporating nutritional management into lifestyle. Needs Review    Patient  undertands incorporating physical activity into lifestyle. Needs Review    Patient understands using medications safely. Needs Review    Patient understands monitoring blood glucose, interpreting and using results Needs Review    Patient understands prevention, detection, and treatment of acute complications. Needs Review    Patient understands prevention, detection, and treatment of chronic complications. Needs Review    Patient understands how to develop strategies to address psychosocial issues. Needs Review    Patient understands how to develop strategies to promote health/change behavior. Needs Review      Outcomes   Expected Outcomes Demonstrated interest in learning. Expect positive outcomes    Future DMSE 4-6 wks    Program Status Not Completed             Individualized Plan for Diabetes Self-Management Training:   Learning Objective:  Patient will have a greater understanding of diabetes self-management. Patient education plan is to  attend individual and/or group sessions per assessed needs and concerns.   Plan:   Patient Instructions  Check your blood sugar each morning before you eat. Look for numbers between 80-100. Under 125 is always good!  Continue to eat three meals a day and participate in the Silver Sneakers program! These are helping keep your blood sugar under control.  Give yourself plenty of grace! You are doing so many things right!   Expected Outcomes:  Demonstrated interest in learning. Expect positive outcomes  Education material provided: N/A  If problems or questions, patient to contact team via:  Phone and Email  Future DSME appointment: 4-6 wks

## 2020-08-15 NOTE — Patient Instructions (Signed)
Check your blood sugar each morning before you eat. Look for numbers between 80-100. Under 125 is always good!  Continue to eat three meals a day and participate in the Silver Sneakers program! These are helping keep your blood sugar under control.  Give yourself plenty of grace! You are doing so many things right!

## 2020-08-26 DIAGNOSIS — R9431 Abnormal electrocardiogram [ECG] [EKG]: Secondary | ICD-10-CM | POA: Diagnosis not present

## 2020-08-26 DIAGNOSIS — R072 Precordial pain: Secondary | ICD-10-CM | POA: Diagnosis not present

## 2020-08-26 DIAGNOSIS — R079 Chest pain, unspecified: Secondary | ICD-10-CM | POA: Diagnosis not present

## 2020-08-26 DIAGNOSIS — R011 Cardiac murmur, unspecified: Secondary | ICD-10-CM | POA: Diagnosis not present

## 2020-08-27 DIAGNOSIS — R9431 Abnormal electrocardiogram [ECG] [EKG]: Secondary | ICD-10-CM | POA: Diagnosis not present

## 2020-09-05 DIAGNOSIS — M25512 Pain in left shoulder: Secondary | ICD-10-CM | POA: Diagnosis not present

## 2020-09-05 DIAGNOSIS — R011 Cardiac murmur, unspecified: Secondary | ICD-10-CM | POA: Diagnosis not present

## 2020-09-05 DIAGNOSIS — M79602 Pain in left arm: Secondary | ICD-10-CM | POA: Diagnosis not present

## 2020-09-05 DIAGNOSIS — E119 Type 2 diabetes mellitus without complications: Secondary | ICD-10-CM | POA: Diagnosis not present

## 2020-09-05 DIAGNOSIS — I10 Essential (primary) hypertension: Secondary | ICD-10-CM | POA: Diagnosis not present

## 2020-09-13 DIAGNOSIS — I350 Nonrheumatic aortic (valve) stenosis: Secondary | ICD-10-CM | POA: Diagnosis not present

## 2020-09-13 DIAGNOSIS — R011 Cardiac murmur, unspecified: Secondary | ICD-10-CM | POA: Diagnosis not present

## 2020-09-13 DIAGNOSIS — I519 Heart disease, unspecified: Secondary | ICD-10-CM | POA: Diagnosis not present

## 2020-10-15 DIAGNOSIS — Z23 Encounter for immunization: Secondary | ICD-10-CM | POA: Diagnosis not present

## 2020-10-16 ENCOUNTER — Ambulatory Visit: Payer: BC Managed Care – PPO | Admitting: Dietician

## 2020-10-17 DIAGNOSIS — Z1231 Encounter for screening mammogram for malignant neoplasm of breast: Secondary | ICD-10-CM | POA: Diagnosis not present

## 2020-10-29 DIAGNOSIS — E1169 Type 2 diabetes mellitus with other specified complication: Secondary | ICD-10-CM | POA: Diagnosis not present

## 2020-10-29 DIAGNOSIS — I10 Essential (primary) hypertension: Secondary | ICD-10-CM | POA: Diagnosis not present

## 2020-10-29 DIAGNOSIS — I119 Hypertensive heart disease without heart failure: Secondary | ICD-10-CM | POA: Diagnosis not present

## 2020-10-30 DIAGNOSIS — M170 Bilateral primary osteoarthritis of knee: Secondary | ICD-10-CM | POA: Diagnosis not present

## 2020-12-04 DIAGNOSIS — E1169 Type 2 diabetes mellitus with other specified complication: Secondary | ICD-10-CM | POA: Diagnosis not present

## 2020-12-04 DIAGNOSIS — R059 Cough, unspecified: Secondary | ICD-10-CM | POA: Diagnosis not present

## 2020-12-04 DIAGNOSIS — E119 Type 2 diabetes mellitus without complications: Secondary | ICD-10-CM | POA: Diagnosis not present

## 2020-12-04 DIAGNOSIS — R635 Abnormal weight gain: Secondary | ICD-10-CM | POA: Diagnosis not present

## 2020-12-04 DIAGNOSIS — R252 Cramp and spasm: Secondary | ICD-10-CM | POA: Diagnosis not present

## 2020-12-04 DIAGNOSIS — Z6836 Body mass index (BMI) 36.0-36.9, adult: Secondary | ICD-10-CM | POA: Diagnosis not present

## 2020-12-13 ENCOUNTER — Other Ambulatory Visit (HOSPITAL_COMMUNITY): Payer: Self-pay | Admitting: Radiology

## 2020-12-13 DIAGNOSIS — R0602 Shortness of breath: Secondary | ICD-10-CM

## 2020-12-14 ENCOUNTER — Other Ambulatory Visit: Payer: Self-pay

## 2020-12-14 DIAGNOSIS — R600 Localized edema: Secondary | ICD-10-CM

## 2020-12-14 DIAGNOSIS — N898 Other specified noninflammatory disorders of vagina: Secondary | ICD-10-CM | POA: Insufficient documentation

## 2020-12-14 DIAGNOSIS — T8484XA Pain due to internal orthopedic prosthetic devices, implants and grafts, initial encounter: Secondary | ICD-10-CM | POA: Insufficient documentation

## 2020-12-14 DIAGNOSIS — E669 Obesity, unspecified: Secondary | ICD-10-CM | POA: Insufficient documentation

## 2020-12-14 DIAGNOSIS — N760 Acute vaginitis: Secondary | ICD-10-CM | POA: Insufficient documentation

## 2020-12-14 DIAGNOSIS — B9689 Other specified bacterial agents as the cause of diseases classified elsewhere: Secondary | ICD-10-CM | POA: Insufficient documentation

## 2020-12-14 DIAGNOSIS — G4700 Insomnia, unspecified: Secondary | ICD-10-CM | POA: Insufficient documentation

## 2020-12-14 DIAGNOSIS — B360 Pityriasis versicolor: Secondary | ICD-10-CM | POA: Insufficient documentation

## 2020-12-14 DIAGNOSIS — F419 Anxiety disorder, unspecified: Secondary | ICD-10-CM | POA: Insufficient documentation

## 2020-12-14 DIAGNOSIS — M25612 Stiffness of left shoulder, not elsewhere classified: Secondary | ICD-10-CM | POA: Insufficient documentation

## 2020-12-14 DIAGNOSIS — M8580 Other specified disorders of bone density and structure, unspecified site: Secondary | ICD-10-CM | POA: Insufficient documentation

## 2020-12-14 DIAGNOSIS — L811 Chloasma: Secondary | ICD-10-CM | POA: Insufficient documentation

## 2020-12-14 DIAGNOSIS — N649 Disorder of breast, unspecified: Secondary | ICD-10-CM | POA: Insufficient documentation

## 2020-12-14 DIAGNOSIS — R200 Anesthesia of skin: Secondary | ICD-10-CM | POA: Insufficient documentation

## 2020-12-14 DIAGNOSIS — M5412 Radiculopathy, cervical region: Secondary | ICD-10-CM | POA: Insufficient documentation

## 2020-12-14 DIAGNOSIS — H349 Unspecified retinal vascular occlusion: Secondary | ICD-10-CM | POA: Insufficient documentation

## 2020-12-17 ENCOUNTER — Encounter (HOSPITAL_COMMUNITY): Payer: BC Managed Care – PPO

## 2020-12-17 DIAGNOSIS — Z20822 Contact with and (suspected) exposure to covid-19: Secondary | ICD-10-CM | POA: Diagnosis not present

## 2020-12-19 ENCOUNTER — Ambulatory Visit (HOSPITAL_COMMUNITY)
Admission: RE | Admit: 2020-12-19 | Discharge: 2020-12-19 | Disposition: A | Discharge status: Home / Self Care | Payer: Medicare Other | Source: Ambulatory Visit | Attending: Vascular Surgery | Admitting: Vascular Surgery

## 2020-12-19 ENCOUNTER — Ambulatory Visit (INDEPENDENT_AMBULATORY_CARE_PROVIDER_SITE_OTHER): Payer: Medicare Other | Admitting: Vascular Surgery

## 2020-12-19 ENCOUNTER — Other Ambulatory Visit: Payer: Self-pay

## 2020-12-19 ENCOUNTER — Encounter: Payer: Self-pay | Admitting: Vascular Surgery

## 2020-12-19 ENCOUNTER — Ambulatory Visit (HOSPITAL_COMMUNITY)
Admission: RE | Admit: 2020-12-19 | Discharge: 2020-12-19 | Disposition: A | Discharge status: Home / Self Care | Payer: Medicare Other | Source: Ambulatory Visit | Attending: Family Medicine | Admitting: Family Medicine

## 2020-12-19 VITALS — BP 121/67 | HR 62 | Temp 97.9°F | Resp 20 | Ht 63.0 in | Wt 193.0 lb

## 2020-12-19 DIAGNOSIS — I10 Essential (primary) hypertension: Secondary | ICD-10-CM | POA: Diagnosis not present

## 2020-12-19 DIAGNOSIS — R600 Localized edema: Secondary | ICD-10-CM | POA: Diagnosis not present

## 2020-12-19 DIAGNOSIS — M25562 Pain in left knee: Secondary | ICD-10-CM | POA: Diagnosis not present

## 2020-12-19 DIAGNOSIS — M25561 Pain in right knee: Secondary | ICD-10-CM | POA: Diagnosis not present

## 2020-12-19 DIAGNOSIS — E785 Hyperlipidemia, unspecified: Secondary | ICD-10-CM | POA: Diagnosis not present

## 2020-12-19 DIAGNOSIS — I6523 Occlusion and stenosis of bilateral carotid arteries: Secondary | ICD-10-CM

## 2020-12-19 DIAGNOSIS — R0602 Shortness of breath: Secondary | ICD-10-CM | POA: Insufficient documentation

## 2020-12-19 DIAGNOSIS — I739 Peripheral vascular disease, unspecified: Secondary | ICD-10-CM | POA: Diagnosis not present

## 2020-12-19 LAB — PULMONARY FUNCTION TEST
DL/VA % pred: 92 %
DL/VA: 3.75 ml/min/mmHg/L
DLCO unc % pred: 71 %
DLCO unc: 12.55 ml/min/mmHg
FEF 25-75 Post: 1.39 L/sec
FEF 25-75 Pre: 1.91 L/sec
FEF2575-%Change-Post: -27 %
FEF2575-%Pred-Post: 160 %
FEF2575-%Pred-Pre: 221 %
FEV1-%Change-Post: -10 %
FEV1-%Pred-Post: 145 %
FEV1-%Pred-Pre: 163 %
FEV1-Post: 1.72 L
FEV1-Pre: 1.93 L
FEV1FVC-%Change-Post: -6 %
FEV1FVC-%Pred-Pre: 108 %
FEV6-%Change-Post: -1 %
FEV6-%Pred-Post: 160 %
FEV6-%Pred-Pre: 163 %
FEV6-Post: 2.3 L
FEV6-Pre: 2.34 L
FEV6FVC-%Change-Post: 2 %
FEV6FVC-%Pred-Post: 105 %
FEV6FVC-%Pred-Pre: 102 %
FVC-%Change-Post: -4 %
FVC-%Pred-Post: 152 %
FVC-%Pred-Pre: 159 %
FVC-Post: 2.31 L
FVC-Pre: 2.42 L
Post FEV1/FVC ratio: 74 %
Post FEV6/FVC ratio: 100 %
Pre FEV1/FVC ratio: 80 %
Pre FEV6/FVC Ratio: 97 %
RV % pred: 98 %
RV: 2.49 L
TLC % pred: 103 %
TLC: 5.08 L

## 2020-12-19 MED ORDER — ALBUTEROL SULFATE (2.5 MG/3ML) 0.083% IN NEBU
2.5000 mg | INHALATION_SOLUTION | Freq: Once | RESPIRATORY_TRACT | Status: AC
  Administered 2020-12-19: 09:00:00 2.5 mg via RESPIRATORY_TRACT

## 2020-12-19 NOTE — Progress Notes (Signed)
Patient ID: Susan Randall, female   DOB: [birth year], 87 y.o.   MRN: [id number]  Reason for Consult: New Patient (Initial Visit)   Referred by Renaye Rakers, MD  Subjective:     HPI:  Susan Randall is a 87 y.o. female previously known to our practice from evaluation for carotid artery stenosis.  She denies any history of stroke.  More recently she has bilateral right greater than left leg pain particularly in her thighs and knee area.  This started during her trip to [place] about 1 month ago afterwards she did have some shortness of breath also.  She denies any recent history of COVID-19 or other respiratory illnesses.  Respecters for vascular disease include hyperlipidemia and hypertension.  She does take a statin she denies taking any aspirin at this time.  She continues to walk and participates in Silver sneakers frequently.  She does not have any tissue loss or ulceration.  She denies any frank claudication symptoms.  She has no previous history of DVT.  Past Medical History:  Diagnosis Date   Anxiety    Arthritis    Breast density    right   Cholesterol retinal embolus    Hyperlipidemia    Hypertension    Insomnia 7/09   Obesity    Osteopenia    Osteoporosis    Family History  Problem Relation Age of Onset   Diabetes Mother    Heart disease Mother    Hypertension Mother    Past Surgical History:  Procedure Laterality Date   APPENDECTOMY  67    Short Social History:  Social History   Tobacco Use   Smoking status: Never   Smokeless tobacco: Never  Substance Use Topics   Alcohol use: Yes    Alcohol/week: 3.0 - 4.0 standard drinks    Types: 3 - 4 Shots of liquor per week    No Known Allergies  Current Outpatient Medications  Medication Sig Dispense Refill   Cholecalciferol (VITAMIN D3) 1.25 MG (50000 UT) CAPS Take 1 capsule by mouth once a week.     fexofenadine (ALLEGRA) 60 MG tablet Take by mouth.     levothyroxine (SYNTHROID) 50 MCG tablet Take  1 tablet by mouth daily.     LINZESS 290 MCG CAPS capsule Take 290 mcg by mouth daily.     montelukast (SINGULAIR) 10 MG tablet Take 10 mg by mouth at bedtime.     Multiple Vitamins-Minerals (CENTRUM SILVER ULTRA WOMENS PO) Take 1 tablet by mouth daily.     simvastatin (ZOCOR) 40 MG tablet Take 40 mg by mouth every evening.     SitaGLIPtin-MetFORMIN HCl (JANUMET XR) 50-500 MG TB24 Janumet XR 50 mg-500 mg tablet,extended release  TAKE 2 TABLETS BY MOUTH EVERY DAY     TIADYLT ER 180 MG 24 hr capsule Take 180 mg by mouth daily.     No current facility-administered medications for this visit.    Review of Systems  Constitutional:  Constitutional negative. HENT: HENT negative.  Eyes: Eyes negative.  Respiratory: Respiratory negative.  Cardiovascular: Cardiovascular negative.  GI: Gastrointestinal negative.  Musculoskeletal: Positive for leg pain and joint pain.  Neurological: Neurological negative. Hematologic: Hematologic/lymphatic negative.  Psychiatric: Psychiatric negative.       Objective:  Objective   Vitals:   12/19/20 1025  BP: 121/67  Pulse: 62  Resp: 20  Temp: 97.9 F (36.6 C)  SpO2: 95%  Weight: 193 lb (87.5 kg)  Height: 5\' 3"  (1.6 m)  Body mass index is 34.19 kg/m.  Physical Exam HENT:     Head: Normocephalic.     Nose:     Comments: Wearing a mask Eyes:     Pupils: Pupils are equal, round, and reactive to light.  Neck:     Vascular: No carotid bruit.  Cardiovascular:     Pulses: Normal pulses.     Heart sounds: Murmur heard.  Systolic murmur is present with a grade of 3/6.  Pulmonary:     Effort: Pulmonary effort is normal.     Breath sounds: Normal breath sounds.  Abdominal:     General: Abdomen is flat.     Palpations: Abdomen is soft. There is no mass.  Musculoskeletal:     Cervical back: Normal range of motion and neck supple.     Right lower leg: No edema.     Left lower leg: No edema.  Skin:    General: Skin is warm and dry.      Capillary Refill: Capillary refill takes less than 2 seconds.  Neurological:     General: No focal deficit present.     Mental Status: She is alert.  Psychiatric:        Mood and Affect: Mood normal.        Behavior: Behavior normal.        Thought Content: Thought content normal.        Judgment: Judgment normal.    Data: ABI Findings:  +---------+------------------+-----+---------+--------+   Right     Rt Pressure (mmHg) Index Waveform  Comment    +---------+------------------+-----+---------+--------+   Brachial  137                                           +---------+------------------+-----+---------+--------+   PTA       156                1.14  triphasic            +---------+------------------+-----+---------+--------+   DP        155                1.13  triphasic            +---------+------------------+-----+---------+--------+   Great Toe 106                0.77                       +---------+------------------+-----+---------+--------+   +---------+------------------+-----+---------+-------+   Left      Lt Pressure (mmHg) Index Waveform  Comment   +---------+------------------+-----+---------+-------+   Brachial  134                                          +---------+------------------+-----+---------+-------+   PTA       151                1.10  triphasic           +---------+------------------+-----+---------+-------+   DP        140                1.02  triphasic           +---------+------------------+-----+---------+-------+   Great Toe 104  0.76                      +---------+------------------+-----+---------+-------+   +-------+-----------+-----------+------------+------------+   ABI/TBI Today's ABI Today's TBI Previous ABI Previous TBI   +-------+-----------+-----------+------------+------------+   Right   1.14        0.77                                    +-------+-----------+-----------+------------+------------+   Left    1.10         0.76                                    +-------+-----------+-----------+------------+------------+      Assessment/Plan:     87 year old female here for leg pain does not appear to be vascular in nature.  Does have known carotid stenosis.  We will follow her up in 1 year with repeat carotid duplex.  We discussed the signs symptoms of stroke.  She will continue to walk and participate in Silver sneakers.     Maeola Harman MD [facility]

## 2020-12-25 DIAGNOSIS — I10 Essential (primary) hypertension: Secondary | ICD-10-CM | POA: Diagnosis not present

## 2020-12-25 DIAGNOSIS — E1169 Type 2 diabetes mellitus with other specified complication: Secondary | ICD-10-CM | POA: Diagnosis not present

## 2021-01-23 DIAGNOSIS — M5432 Sciatica, left side: Secondary | ICD-10-CM | POA: Diagnosis not present

## 2021-01-29 DIAGNOSIS — M7632 Iliotibial band syndrome, left leg: Secondary | ICD-10-CM | POA: Diagnosis not present

## 2021-01-29 DIAGNOSIS — M170 Bilateral primary osteoarthritis of knee: Secondary | ICD-10-CM | POA: Diagnosis not present

## 2021-01-29 DIAGNOSIS — M25552 Pain in left hip: Secondary | ICD-10-CM | POA: Diagnosis not present

## 2021-02-08 DIAGNOSIS — R197 Diarrhea, unspecified: Secondary | ICD-10-CM | POA: Diagnosis not present

## 2021-02-08 DIAGNOSIS — R1110 Vomiting, unspecified: Secondary | ICD-10-CM | POA: Diagnosis not present

## 2021-02-08 DIAGNOSIS — Z20822 Contact with and (suspected) exposure to covid-19: Secondary | ICD-10-CM | POA: Diagnosis not present

## 2021-02-08 DIAGNOSIS — R519 Headache, unspecified: Secondary | ICD-10-CM | POA: Diagnosis not present

## 2021-02-14 DIAGNOSIS — J3489 Other specified disorders of nose and nasal sinuses: Secondary | ICD-10-CM | POA: Diagnosis not present

## 2021-02-14 DIAGNOSIS — R059 Cough, unspecified: Secondary | ICD-10-CM | POA: Diagnosis not present

## 2021-02-14 DIAGNOSIS — R0981 Nasal congestion: Secondary | ICD-10-CM | POA: Diagnosis not present

## 2021-02-14 DIAGNOSIS — R0982 Postnasal drip: Secondary | ICD-10-CM | POA: Diagnosis not present

## 2021-02-14 DIAGNOSIS — Z20822 Contact with and (suspected) exposure to covid-19: Secondary | ICD-10-CM | POA: Diagnosis not present

## 2021-03-04 DIAGNOSIS — R634 Abnormal weight loss: Secondary | ICD-10-CM | POA: Diagnosis not present

## 2021-03-04 DIAGNOSIS — Z6833 Body mass index (BMI) 33.0-33.9, adult: Secondary | ICD-10-CM | POA: Diagnosis not present

## 2021-03-04 DIAGNOSIS — E785 Hyperlipidemia, unspecified: Secondary | ICD-10-CM | POA: Diagnosis not present

## 2021-03-04 DIAGNOSIS — T7840XA Allergy, unspecified, initial encounter: Secondary | ICD-10-CM | POA: Diagnosis not present

## 2021-03-04 DIAGNOSIS — E1169 Type 2 diabetes mellitus with other specified complication: Secondary | ICD-10-CM | POA: Diagnosis not present

## 2021-04-25 DIAGNOSIS — E1169 Type 2 diabetes mellitus with other specified complication: Secondary | ICD-10-CM | POA: Diagnosis not present

## 2021-04-25 DIAGNOSIS — E7800 Pure hypercholesterolemia, unspecified: Secondary | ICD-10-CM | POA: Diagnosis not present

## 2021-04-25 DIAGNOSIS — R635 Abnormal weight gain: Secondary | ICD-10-CM | POA: Diagnosis not present

## 2021-06-11 DIAGNOSIS — M130 Polyarthritis, unspecified: Secondary | ICD-10-CM | POA: Diagnosis not present

## 2021-06-11 DIAGNOSIS — E785 Hyperlipidemia, unspecified: Secondary | ICD-10-CM | POA: Diagnosis not present

## 2021-06-11 DIAGNOSIS — I110 Hypertensive heart disease with heart failure: Secondary | ICD-10-CM | POA: Diagnosis not present

## 2021-06-11 DIAGNOSIS — E782 Mixed hyperlipidemia: Secondary | ICD-10-CM | POA: Diagnosis not present

## 2021-06-11 DIAGNOSIS — R600 Localized edema: Secondary | ICD-10-CM | POA: Diagnosis not present

## 2021-06-11 DIAGNOSIS — E039 Hypothyroidism, unspecified: Secondary | ICD-10-CM | POA: Diagnosis not present

## 2021-06-11 DIAGNOSIS — Z0000 Encounter for general adult medical examination without abnormal findings: Secondary | ICD-10-CM | POA: Diagnosis not present

## 2021-06-11 DIAGNOSIS — E1169 Type 2 diabetes mellitus with other specified complication: Secondary | ICD-10-CM | POA: Diagnosis not present

## 2021-06-11 DIAGNOSIS — E6609 Other obesity due to excess calories: Secondary | ICD-10-CM | POA: Diagnosis not present

## 2021-06-11 DIAGNOSIS — E119 Type 2 diabetes mellitus without complications: Secondary | ICD-10-CM | POA: Diagnosis not present

## 2021-06-28 DIAGNOSIS — M65341 Trigger finger, right ring finger: Secondary | ICD-10-CM | POA: Diagnosis not present

## 2021-06-28 DIAGNOSIS — M65331 Trigger finger, right middle finger: Secondary | ICD-10-CM | POA: Diagnosis not present

## 2021-06-28 DIAGNOSIS — M159 Polyosteoarthritis, unspecified: Secondary | ICD-10-CM | POA: Diagnosis not present

## 2021-07-10 ENCOUNTER — Ambulatory Visit (INDEPENDENT_AMBULATORY_CARE_PROVIDER_SITE_OTHER): Payer: Medicare Other | Admitting: Cardiovascular Disease

## 2021-07-10 VITALS — BP 128/66 | HR 57 | Ht 63.0 in | Wt 195.2 lb

## 2021-07-10 DIAGNOSIS — R011 Cardiac murmur, unspecified: Secondary | ICD-10-CM

## 2021-07-10 DIAGNOSIS — R0989 Other specified symptoms and signs involving the circulatory and respiratory systems: Secondary | ICD-10-CM

## 2021-07-10 NOTE — Progress Notes (Signed)
Cardiology Office Note:    Date:  07/10/2021   ID:  SALEEN PEDEN, DOB Sep 14, 1928, MRN [id number]  PCP:  Renaye Rakers, MD   [facility] Providers Cardiologist:  Ho Parisi   }    Referring MD: Renaye Rakers, MD   Chief Complaint  Patient presents with   Leg Swelling     History of Present Illness:  07/10/21   Susan Randall is a 88 y.o. female with a hx of HTN,HLD ,   We were asked to see her today for evaluation of leg edema by Dr. Parke Simmers .   Very rare swelling of her feet.  No cp or dyspnea Some DOE if she waks too far.  Does exercise at an exercise class [facility] [place])  6 days a week   Her lipids were reviewed and look great .   Has been on Simvastatin   Has had [facility] screening and she thinks she has some plaque build up somewhere        Past Medical History:  Diagnosis Date   Anxiety    Arthritis    Breast density    right   Cholesterol retinal embolus    Hyperlipidemia    Hypertension    Insomnia 07/07/2007   Lipid disorder    Obesity    Osteopenia    Osteoporosis     Past Surgical History:  Procedure Laterality Date   APPENDECTOMY  1944    Current Medications: Current Meds  Medication Sig   AMLODIPINE BESYLATE PO Take 2.5 mg by mouth daily.   Cholecalciferol (VITAMIN D3) 1.25 MG (50000 UT) CAPS Take 1 capsule by mouth once a week.   clonazePAM (KLONOPIN) 1 MG tablet Take 1 mg by mouth 2 (two) times daily. 1/2 BID FOR 7 DAYS THE 1 BID   diclofenac Sodium (VOLTAREN) 1 % GEL Apply 2 g topically 4 (four) times daily.   Fesoterodine Fumarate (TOVIAZ PO) Take 4 mg by mouth daily.   fexofenadine (ALLEGRA) 60 MG tablet Take by mouth.   levothyroxine (SYNTHROID) 50 MCG tablet Take 1 tablet by mouth daily.   LINZESS 290 MCG CAPS capsule Take 290 mcg by mouth daily.   methocarbamol (ROBAXIN) 500 MG tablet Take 500 mg by mouth 4 (four) times daily.   montelukast (SINGULAIR) 10 MG tablet Take 10 mg by mouth at bedtime.   Multiple  Vitamins-Minerals (CENTRUM SILVER ULTRA WOMENS PO) Take 1 tablet by mouth daily.   simvastatin (ZOCOR) 40 MG tablet Take 40 mg by mouth every evening.   SitaGLIPtin-MetFORMIN HCl (JANUMET XR) 50-500 MG TB24 Janumet XR 50 mg-500 mg tablet,extended release  TAKE 2 TABLETS BY MOUTH EVERY DAY   valsartan-hydrochlorothiazide (DIOVAN-HCT) 160-25 MG tablet Take 1 tablet by mouth daily.     Allergies:   Patient has no known allergies.   Social History   Socioeconomic History   Marital status: Married    Spouse name: Not on file   Number of children: Not on file   Years of education: Not on file   Highest education level: Not on file  Occupational History   Not on file  Tobacco Use   Smoking status: Never   Smokeless tobacco: Never  Vaping Use   Vaping Use: Never used  Substance and Sexual Activity   Alcohol use: Yes    Alcohol/week: 3.0 - 4.0 standard drinks of alcohol    Types: 3 - 4 Shots of liquor per week   Drug use: No   Sexual  activity: Not on file  Other Topics Concern   Not on file  Social History Narrative   Not on file   Social Determinants of Health   Financial Resource Strain: Not on file  Food Insecurity: Not on file  Transportation Needs: Not on file  Physical Activity: Not on file  Stress: Not on file  Social Connections: Not on file     Family History: The patient's family history includes Diabetes in her mother; Heart disease in her mother; Hypertension in her mother.  ROS:   Please see the history of present illness.     All other systems reviewed and are negative.  EKGs/Labs/Other Studies Reviewed:    The following studies were reviewed today:   EKG: July 10, 2021: Sinus bradycardia at 57.  Isolated Q waves in lead III  Recent Labs: No results found for requested labs within last 365 days.  Recent Lipid Panel No results found for: "CHOL", "TRIG", "HDL", "CHOLHDL", "VLDL", "LDLCALC", "LDLDIRECT"   Risk Assessment/Calculations:            Physical Exam:    VS:  BP 128/66   Pulse (!) 57   Ht 5\' 3"  (1.6 m)   Wt 195 lb 3.2 oz (88.5 kg)   SpO2 97%   BMI 34.58 kg/m     Wt Readings from Last 3 Encounters:  07/10/21 195 lb 3.2 oz (88.5 kg)  12/19/20 193 lb (87.5 kg)  08/08/14 206 lb (93.4 kg)     GEN:  Well nourished, well developed in no acute distress. Moderately obese HEENT: Normal NECK: No JVD; soft bilat carotid bruits. LYMPHATICS: No lymphadenopathy CARDIAC: RR , 2/67 systolic murmur radiates to the L ax line  RESPIRATORY:  Clear to auscultation without rales, wheezing or rhonchi  ABDOMEN: Soft, non-tender, non-distended MUSCULOSKELETAL:  No edema; No deformity  SKIN: Warm and dry NEUROLOGIC:  Alert and oriented x 3 PSYCHIATRIC:  Normal affect   ASSESSMENT:    1. Murmur   2. Carotid bruit, unspecified laterality    PLAN:    In order of problems listed above:  Heart murmur:  will get an echo for further evaluation .    2.  Carotid duplex :  has  very soft bilateral carotid bruits.  Will get a carotid duplex scan .   Overall she is in excellent health for 88 years old.  I will plan on seeing her back in 1 year.           Medication Adjustments/Labs and Tests Ordered: Current medicines are reviewed at length with the patient today.  Concerns regarding medicines are outlined above.  Orders Placed This Encounter  Procedures   EKG 12-Lead   ECHOCARDIOGRAM COMPLETE   VAS 95 CAROTID   No orders of the defined types were placed in this encounter.   Patient Instructions  Medication Instructions:  Your physician recommends that you continue on your current medications as directed. Please refer to the Current Medication list given to you today.  *If you need a refill on your cardiac medications before your next appointment, please call your pharmacy*  Testing/Procedures: ECHO Your physician has requested that you have an echocardiogram. Echocardiography is a painless test that uses sound waves  to create images of your heart. It provides your doctor with information about the size and shape of your heart and how well your heart's chambers and valves are working. This procedure takes approximately one hour. There are no restrictions for this procedure.  Carotid US  Your physician has requested that you have a carotid duplex. This test is an ultrasound of the carotid arteries in your neck. It looks at blood flow through these arteries that supply the brain with blood. Allow one hour for this exam. There are no restrictions or special instructions.    Follow-Up: At [facility], you and your health needs are our priority.  As part of our continuing mission to provide you with exceptional heart care, we have created designated Provider Care Teams.  These Care Teams include your primary Cardiologist (physician) and Advanced Practice Providers (APPs -  Physician Assistants and Nurse Practitioners) who all work together to provide you with the care you need, when you need it.  We recommend signing up for the patient portal called "MyChart".  Sign up information is provided on this After Visit Summary.  MyChart is used to connect with patients for Virtual Visits (Telemedicine).  Patients are able to view lab/test results, encounter notes, upcoming appointments, etc.  Non-urgent messages can be sent to your provider as well.   To learn more about what you can do with MyChart, go to [email or web address].    Your next appointment:   1 year(s)  The format for your next appointment:   In Person  Provider:   None {           Signed, Kristeen Miss, MD  07/10/2021 11:17 AM    [facility]

## 2021-07-10 NOTE — Patient Instructions (Signed)
Medication Instructions:  Your physician recommends that you continue on your current medications as directed. Please refer to the Current Medication list given to you today.  *If you need a refill on your cardiac medications before your next appointment, please call your pharmacy*  Testing/Procedures: ECHO Your physician has requested that you have an echocardiogram. Echocardiography is a painless test that uses sound waves to create images of your heart. It provides your doctor with information about the size and shape of your heart and how well your heart's chambers and valves are working. This procedure takes approximately one hour. There are no restrictions for this procedure.  Carotid US Your physician has requested that you have a carotid duplex. This test is an ultrasound of the carotid arteries in your neck. It looks at blood flow through these arteries that supply the brain with blood. Allow one hour for this exam. There are no restrictions or special instructions.    Follow-Up: At [facility], you and your health needs are our priority.  As part of our continuing mission to provide you with exceptional heart care, we have created designated Provider Care Teams.  These Care Teams include your primary Cardiologist (physician) and Advanced Practice Providers (APPs -  Physician Assistants and Nurse Practitioners) who all work together to provide you with the care you need, when you need it.  We recommend signing up for the patient portal called "MyChart".  Sign up information is provided on this After Visit Summary.  MyChart is used to connect with patients for Virtual Visits (Telemedicine).  Patients are able to view lab/test results, encounter notes, upcoming appointments, etc.  Non-urgent messages can be sent to your provider as well.   To learn more about what you can do with MyChart, go to [email or web address].    Your next appointment:   1 year(s)  The format for your next  appointment:   In Person  Provider:   None {

## 2021-07-17 DIAGNOSIS — M159 Polyosteoarthritis, unspecified: Secondary | ICD-10-CM | POA: Diagnosis not present

## 2021-07-17 DIAGNOSIS — M65341 Trigger finger, right ring finger: Secondary | ICD-10-CM | POA: Diagnosis not present

## 2021-07-17 DIAGNOSIS — M65331 Trigger finger, right middle finger: Secondary | ICD-10-CM | POA: Diagnosis not present

## 2021-07-22 ENCOUNTER — Ambulatory Visit (HOSPITAL_COMMUNITY)
Admission: RE | Admit: 2021-07-22 | Discharge: 2021-07-22 | Disposition: A | Discharge status: Home / Self Care | Payer: Medicare Other | Source: Ambulatory Visit | Attending: Cardiology | Admitting: Cardiology

## 2021-07-22 DIAGNOSIS — R0989 Other specified symptoms and signs involving the circulatory and respiratory systems: Secondary | ICD-10-CM | POA: Insufficient documentation

## 2021-07-22 DIAGNOSIS — R011 Cardiac murmur, unspecified: Secondary | ICD-10-CM | POA: Insufficient documentation

## 2021-07-25 ENCOUNTER — Ambulatory Visit (HOSPITAL_COMMUNITY): Payer: Medicare Other | Attending: Internal Medicine

## 2021-07-25 DIAGNOSIS — R0989 Other specified symptoms and signs involving the circulatory and respiratory systems: Secondary | ICD-10-CM | POA: Diagnosis not present

## 2021-07-25 DIAGNOSIS — R011 Cardiac murmur, unspecified: Secondary | ICD-10-CM | POA: Diagnosis not present

## 2021-07-25 LAB — ECHOCARDIOGRAM COMPLETE
AR max vel: 1.26 cm2
AV Area VTI: 1.34 cm2
AV Area mean vel: 1.19 cm2
AV Mean grad: 20 mmHg
AV Peak grad: 36.5 mmHg
Ao pk vel: 3.02 m/s
Area-P 1/2: 3.14 cm2
S' Lateral: 2.4 cm

## 2021-07-27 DIAGNOSIS — Z6834 Body mass index (BMI) 34.0-34.9, adult: Secondary | ICD-10-CM | POA: Diagnosis not present

## 2021-07-27 DIAGNOSIS — J029 Acute pharyngitis, unspecified: Secondary | ICD-10-CM | POA: Diagnosis not present

## 2021-07-27 DIAGNOSIS — R051 Acute cough: Secondary | ICD-10-CM | POA: Diagnosis not present

## 2021-07-27 DIAGNOSIS — U071 COVID-19: Secondary | ICD-10-CM | POA: Diagnosis not present

## 2021-08-29 DIAGNOSIS — I10 Essential (primary) hypertension: Secondary | ICD-10-CM | POA: Diagnosis not present

## 2021-08-29 DIAGNOSIS — E1169 Type 2 diabetes mellitus with other specified complication: Secondary | ICD-10-CM | POA: Diagnosis not present

## 2021-09-06 ENCOUNTER — Encounter: Payer: Self-pay | Admitting: Podiatry

## 2021-09-06 ENCOUNTER — Ambulatory Visit (INDEPENDENT_AMBULATORY_CARE_PROVIDER_SITE_OTHER): Payer: Medicare Other

## 2021-09-06 ENCOUNTER — Ambulatory Visit (INDEPENDENT_AMBULATORY_CARE_PROVIDER_SITE_OTHER): Payer: Medicare Other | Admitting: Podiatry

## 2021-09-06 DIAGNOSIS — M779 Enthesopathy, unspecified: Secondary | ICD-10-CM

## 2021-09-06 DIAGNOSIS — G629 Polyneuropathy, unspecified: Secondary | ICD-10-CM

## 2021-09-06 NOTE — Progress Notes (Signed)
Subjective:   Patient ID: Susan Randall, female   DOB: 88 y.o.   MRN: [id number]   HPI Patient presents with numbing pains over the last month and states that she has had a change in lifestyle and is now living in assisted living.  Its not causing any issues with balance and she has not had any falls it is just something that is disconcerting patient does not smoke likes to be active   Review of Systems  All other systems reviewed and are negative.       Objective:  Physical Exam Vitals and nursing note reviewed.  Constitutional:      Appearance: She is well-developed.  Pulmonary:     Effort: Pulmonary effort is normal.  Musculoskeletal:        General: Normal range of motion.  Skin:    General: Skin is warm.  Neurological:     Mental Status: She is alert.     Neurovascular status was found to be intact muscle strength was found to be adequate range of motion subtalar midtarsal joint within normal limits.  Patient does have moderate depression of the arch bilateral no other pathology good digital perfusion     Assessment:  Probability she had a change in lifestyle which may have created some stress and possible low-grade numbing symptoms but no indications of systemic condition     Plan:  H&P reviewed different activities she can do to try to increase activity levels and wear a better structured tennis shoe.  She will work on this and I do think she will be fine but I gave instructions if that she get worse I need to see her back

## 2021-09-16 DIAGNOSIS — H524 Presbyopia: Secondary | ICD-10-CM | POA: Diagnosis not present

## 2021-09-16 DIAGNOSIS — Z961 Presence of intraocular lens: Secondary | ICD-10-CM | POA: Diagnosis not present

## 2021-09-16 DIAGNOSIS — H04123 Dry eye syndrome of bilateral lacrimal glands: Secondary | ICD-10-CM | POA: Diagnosis not present

## 2021-09-16 DIAGNOSIS — H5213 Myopia, bilateral: Secondary | ICD-10-CM | POA: Diagnosis not present

## 2021-09-16 DIAGNOSIS — H40023 Open angle with borderline findings, high risk, bilateral: Secondary | ICD-10-CM | POA: Diagnosis not present

## 2021-10-12 DIAGNOSIS — J302 Other seasonal allergic rhinitis: Secondary | ICD-10-CM | POA: Diagnosis not present

## 2021-10-12 DIAGNOSIS — Z20822 Contact with and (suspected) exposure to covid-19: Secondary | ICD-10-CM | POA: Diagnosis not present

## 2021-10-14 DIAGNOSIS — Z23 Encounter for immunization: Secondary | ICD-10-CM | POA: Diagnosis not present

## 2021-10-18 DIAGNOSIS — Z1231 Encounter for screening mammogram for malignant neoplasm of breast: Secondary | ICD-10-CM | POA: Diagnosis not present

## 2021-12-06 DIAGNOSIS — H903 Sensorineural hearing loss, bilateral: Secondary | ICD-10-CM | POA: Diagnosis not present

## 2021-12-12 ENCOUNTER — Other Ambulatory Visit (HOSPITAL_COMMUNITY): Payer: Self-pay | Admitting: Family Medicine

## 2021-12-12 DIAGNOSIS — E785 Hyperlipidemia, unspecified: Secondary | ICD-10-CM | POA: Diagnosis not present

## 2021-12-12 DIAGNOSIS — I10 Essential (primary) hypertension: Secondary | ICD-10-CM | POA: Diagnosis not present

## 2021-12-12 DIAGNOSIS — R413 Other amnesia: Secondary | ICD-10-CM

## 2021-12-12 DIAGNOSIS — E1169 Type 2 diabetes mellitus with other specified complication: Secondary | ICD-10-CM | POA: Diagnosis not present

## 2021-12-12 DIAGNOSIS — Z6835 Body mass index (BMI) 35.0-35.9, adult: Secondary | ICD-10-CM | POA: Diagnosis not present

## 2021-12-12 DIAGNOSIS — R600 Localized edema: Secondary | ICD-10-CM | POA: Diagnosis not present

## 2021-12-25 ENCOUNTER — Ambulatory Visit (HOSPITAL_COMMUNITY)
Admission: RE | Admit: 2021-12-25 | Discharge: 2021-12-25 | Disposition: A | Discharge status: Home / Self Care | Payer: Medicare Other | Source: Ambulatory Visit | Attending: Family Medicine | Admitting: Family Medicine

## 2021-12-25 DIAGNOSIS — R413 Other amnesia: Secondary | ICD-10-CM | POA: Diagnosis not present

## 2021-12-25 DIAGNOSIS — E785 Hyperlipidemia, unspecified: Secondary | ICD-10-CM | POA: Insufficient documentation

## 2021-12-25 DIAGNOSIS — I10 Essential (primary) hypertension: Secondary | ICD-10-CM | POA: Diagnosis not present

## 2021-12-25 DIAGNOSIS — E119 Type 2 diabetes mellitus without complications: Secondary | ICD-10-CM | POA: Insufficient documentation

## 2021-12-25 DIAGNOSIS — R011 Cardiac murmur, unspecified: Secondary | ICD-10-CM | POA: Diagnosis not present

## 2021-12-25 DIAGNOSIS — I350 Nonrheumatic aortic (valve) stenosis: Secondary | ICD-10-CM | POA: Insufficient documentation

## 2021-12-25 LAB — ECHOCARDIOGRAM COMPLETE
AR max vel: 0.9 cm2
AV Area VTI: 1.02 cm2
AV Area mean vel: 0.84 cm2
AV Mean grad: 14 mmHg
AV Peak grad: 24.6 mmHg
Ao pk vel: 2.48 m/s
Area-P 1/2: 2.71 cm2
S' Lateral: 3 cm

## 2021-12-25 NOTE — Progress Notes (Signed)
  Echocardiogram 2D Echocardiogram has been performed.  Delcie Roch 12/25/2021, 11:00 AM

## 2022-01-22 DIAGNOSIS — R351 Nocturia: Secondary | ICD-10-CM | POA: Diagnosis not present

## 2022-01-22 DIAGNOSIS — E1169 Type 2 diabetes mellitus with other specified complication: Secondary | ICD-10-CM | POA: Diagnosis not present

## 2022-01-22 DIAGNOSIS — E039 Hypothyroidism, unspecified: Secondary | ICD-10-CM | POA: Diagnosis not present

## 2022-02-19 DIAGNOSIS — F4381 Prolonged grief disorder: Secondary | ICD-10-CM | POA: Diagnosis not present

## 2022-02-19 DIAGNOSIS — R143 Flatulence: Secondary | ICD-10-CM | POA: Diagnosis not present

## 2022-02-19 DIAGNOSIS — E785 Hyperlipidemia, unspecified: Secondary | ICD-10-CM | POA: Diagnosis not present

## 2022-02-19 DIAGNOSIS — F411 Generalized anxiety disorder: Secondary | ICD-10-CM | POA: Diagnosis not present

## 2022-02-19 DIAGNOSIS — E1169 Type 2 diabetes mellitus with other specified complication: Secondary | ICD-10-CM | POA: Diagnosis not present

## 2022-03-11 ENCOUNTER — Other Ambulatory Visit: Payer: Self-pay

## 2022-03-11 DIAGNOSIS — I6523 Occlusion and stenosis of bilateral carotid arteries: Secondary | ICD-10-CM

## 2022-03-26 ENCOUNTER — Ambulatory Visit (INDEPENDENT_AMBULATORY_CARE_PROVIDER_SITE_OTHER): Payer: Medicare Other | Admitting: Physician Assistant

## 2022-03-26 ENCOUNTER — Ambulatory Visit (HOSPITAL_COMMUNITY)
Admission: RE | Admit: 2022-03-26 | Discharge: 2022-03-26 | Disposition: A | Discharge status: Home / Self Care | Payer: Medicare Other | Source: Ambulatory Visit | Attending: Vascular Surgery | Admitting: Vascular Surgery

## 2022-03-26 VITALS — BP 107/69 | HR 61 | Temp 97.6°F | Wt 195.0 lb

## 2022-03-26 DIAGNOSIS — I6523 Occlusion and stenosis of bilateral carotid arteries: Secondary | ICD-10-CM

## 2022-03-26 NOTE — Progress Notes (Signed)
Office Note     CC:  follow up Requesting Provider:  Lucianne Lei, MD  HPI: Susan Randall is a 89 y.o. (March 27, 1928) female who presents for surveillance of carotid artery stenosis.  Since last office visit 1 year ago she denies any diagnosis of CVA or TIA.  She also has not had any neurological events including slurring speech, changes in vision, or one-sided weakness.  She is on a daily statin.  She follows regularly with her PCP for management of chronic medical conditions including hyperlipidemia, and hypertension.  It should also be noted that she has been seen in the past with ABI studies.  She denies any claudication, rest pain, or tissue loss of bilateral lower extremities.    Past Medical History:  Diagnosis Date   Anxiety    Arthritis    Breast density    right   Cholesterol retinal embolus    Hyperlipidemia    Hypertension    Insomnia 07/07/2007   Lipid disorder    Obesity    Osteopenia    Osteoporosis     Past Surgical History:  Procedure Laterality Date   APPENDECTOMY  1944    Social History   Socioeconomic History   Marital status: Married    Spouse name: Not on file   Number of children: Not on file   Years of education: Not on file   Highest education level: Not on file  Occupational History   Not on file  Tobacco Use   Smoking status: Never   Smokeless tobacco: Never  Vaping Use   Vaping Use: Never used  Substance and Sexual Activity   Alcohol use: Yes    Alcohol/week: 3.0 - 4.0 standard drinks of alcohol    Types: 3 - 4 Shots of liquor per week   Drug use: No   Sexual activity: Not on file  Other Topics Concern   Not on file  Social History Narrative   Not on file   Social Determinants of Health   Financial Resource Strain: Not on file  Food Insecurity: Not on file  Transportation Needs: Not on file  Physical Activity: Not on file  Stress: Not on file  Social Connections: Not on file  Intimate Partner Violence: Not on file     Family History  Problem Relation Age of Onset   Diabetes Mother    Heart disease Mother    Hypertension Mother     Current Outpatient Medications  Medication Sig Dispense Refill   Cholecalciferol (VITAMIN D3) 1.25 MG (50000 UT) CAPS Take 1 capsule by mouth once a week.     diltiazem (TIAZAC) 180 MG 24 hr capsule Take 180 mg by mouth daily.     fexofenadine (ALLEGRA) 60 MG tablet Take by mouth.     levothyroxine (SYNTHROID) 50 MCG tablet Take 1 tablet by mouth daily.     LINZESS 290 MCG CAPS capsule Take 290 mcg by mouth daily.     omega-3 acid ethyl esters (LOVAZA) 1 g capsule Take 1 g by mouth 2 (two) times daily.     simvastatin (ZOCOR) 40 MG tablet Take 40 mg by mouth every evening.     SitaGLIPtin-MetFORMIN HCl (JANUMET XR) 50-500 MG TB24 Janumet XR 50 mg-500 mg tablet,extended release  TAKE 2 TABLETS BY MOUTH EVERY DAY     valsartan-hydrochlorothiazide (DIOVAN-HCT) 160-25 MG tablet Take 1 tablet by mouth daily.     No current facility-administered medications for this visit.    No Known Allergies  REVIEW OF SYSTEMS:   [X]  denotes positive finding, [ ]  denotes negative finding Cardiac  Comments:  Chest pain or chest pressure:    Shortness of breath upon exertion:    Short of breath when lying flat:    Irregular heart rhythm:        Vascular    Pain in calf, thigh, or hip brought on by ambulation:    Pain in feet at night that wakes you up from your sleep:     Blood clot in your veins:    Leg swelling:         Pulmonary    Oxygen at home:    Productive cough:     Wheezing:         Neurologic    Sudden weakness in arms or legs:     Sudden numbness in arms or legs:     Sudden onset of difficulty speaking or slurred speech:    Temporary loss of vision in one eye:     Problems with dizziness:         Gastrointestinal    Blood in stool:     Vomited blood:         Genitourinary    Burning when urinating:     Blood in urine:        Psychiatric    Major  depression:         Hematologic    Bleeding problems:    Problems with blood clotting too easily:        Skin    Rashes or ulcers:        Constitutional    Fever or chills:      PHYSICAL EXAMINATION:  Vitals:   03/26/22 1455 03/26/22 1456  BP: 117/71 107/69  Pulse: 61   Temp: 97.6 F (36.4 C)   TempSrc: Temporal   SpO2: 98%   Weight: 195 lb (88.5 kg)     General:  WDWN in NAD; vital signs documented above Gait: Not observed HENT: WNL, normocephalic Pulmonary: normal non-labored breathing  Cardiac: regular HR Abdomen: soft, NT, no masses Skin: without rashes Vascular Exam/Pulses:  Right Left  Radial 2+ (normal) 2+ (normal)  DP 2+ (normal) 2+ (normal)   Extremities: without ischemic changes, without Gangrene , without cellulitis; without open wounds;  Musculoskeletal: no muscle wasting or atrophy  Neurologic: A&O X 3;  CN grossly intact Psychiatric:  The pt has Normal affect.   Non-Invasive Vascular Imaging:   Bilateral ICAs with 1 to 39% stenosis    ASSESSMENT/PLAN:: 89 y.o. female here for follow up for carotid artery surveillance  -Patient has not had any neurological events since last office visit and has not been diagnosed with a TIA or CVA.  Carotid duplex is unchanged with lower end of 1 to 39% stenosis bilaterally.  Given that she has not had any evidence of flow-limiting stenosis dating back to 2013, I believe it would be reasonable to discontinue surveillance.  She also has easily palpable 2+ DP pulses bilaterally thus we did not recheck her ABIs.  If she were to develop lower extremity symptoms or would prefer to have her carotid surveillance continued she will notify our office.  Otherwise, patient will follow-up on an as-needed basis.   Dagoberto Ligas, PA-C [facility] [phone]  Clinic MD:   Donzetta Matters

## 2022-04-21 DIAGNOSIS — H66002 Acute suppurative otitis media without spontaneous rupture of ear drum, left ear: Secondary | ICD-10-CM | POA: Diagnosis not present

## 2022-04-21 DIAGNOSIS — Z6833 Body mass index (BMI) 33.0-33.9, adult: Secondary | ICD-10-CM | POA: Diagnosis not present

## 2022-04-21 DIAGNOSIS — R051 Acute cough: Secondary | ICD-10-CM | POA: Diagnosis not present

## 2022-04-21 DIAGNOSIS — I10 Essential (primary) hypertension: Secondary | ICD-10-CM | POA: Diagnosis not present

## 2022-04-28 DIAGNOSIS — J9801 Acute bronchospasm: Secondary | ICD-10-CM | POA: Diagnosis not present

## 2022-04-28 DIAGNOSIS — I10 Essential (primary) hypertension: Secondary | ICD-10-CM | POA: Diagnosis not present

## 2022-04-28 DIAGNOSIS — E1169 Type 2 diabetes mellitus with other specified complication: Secondary | ICD-10-CM | POA: Diagnosis not present

## 2022-04-28 DIAGNOSIS — J301 Allergic rhinitis due to pollen: Secondary | ICD-10-CM | POA: Diagnosis not present

## 2022-05-08 DIAGNOSIS — E782 Mixed hyperlipidemia: Secondary | ICD-10-CM | POA: Diagnosis not present

## 2022-05-08 DIAGNOSIS — I10 Essential (primary) hypertension: Secondary | ICD-10-CM | POA: Diagnosis not present

## 2022-05-08 DIAGNOSIS — E7800 Pure hypercholesterolemia, unspecified: Secondary | ICD-10-CM | POA: Diagnosis not present

## 2022-05-08 DIAGNOSIS — F411 Generalized anxiety disorder: Secondary | ICD-10-CM | POA: Diagnosis not present

## 2022-05-08 DIAGNOSIS — E039 Hypothyroidism, unspecified: Secondary | ICD-10-CM | POA: Diagnosis not present

## 2022-05-08 DIAGNOSIS — E1169 Type 2 diabetes mellitus with other specified complication: Secondary | ICD-10-CM | POA: Diagnosis not present

## 2022-06-11 DIAGNOSIS — M65331 Trigger finger, right middle finger: Secondary | ICD-10-CM | POA: Diagnosis not present

## 2022-06-19 DIAGNOSIS — I10 Essential (primary) hypertension: Secondary | ICD-10-CM | POA: Diagnosis not present

## 2022-06-19 DIAGNOSIS — I119 Hypertensive heart disease without heart failure: Secondary | ICD-10-CM | POA: Diagnosis not present

## 2022-06-19 DIAGNOSIS — E782 Mixed hyperlipidemia: Secondary | ICD-10-CM | POA: Diagnosis not present

## 2022-06-19 DIAGNOSIS — E119 Type 2 diabetes mellitus without complications: Secondary | ICD-10-CM | POA: Diagnosis not present

## 2022-06-19 DIAGNOSIS — R011 Cardiac murmur, unspecified: Secondary | ICD-10-CM | POA: Diagnosis not present

## 2022-07-16 DIAGNOSIS — M65341 Trigger finger, right ring finger: Secondary | ICD-10-CM | POA: Diagnosis not present

## 2022-07-20 ENCOUNTER — Observation Stay (HOSPITAL_COMMUNITY): Payer: Medicare Other

## 2022-07-20 ENCOUNTER — Observation Stay (HOSPITAL_COMMUNITY)
Admission: EM | Admit: 2022-07-20 | Discharge: 2022-07-21 | Disposition: A | Discharge status: Home / Self Care | Payer: Medicare Other | Attending: Internal Medicine | Admitting: Internal Medicine

## 2022-07-20 ENCOUNTER — Other Ambulatory Visit: Payer: Self-pay

## 2022-07-20 ENCOUNTER — Emergency Department (HOSPITAL_COMMUNITY): Payer: Medicare Other

## 2022-07-20 ENCOUNTER — Encounter (HOSPITAL_COMMUNITY): Payer: Self-pay | Admitting: Emergency Medicine

## 2022-07-20 DIAGNOSIS — R2990 Unspecified symptoms and signs involving the nervous system: Principal | ICD-10-CM | POA: Diagnosis present

## 2022-07-20 DIAGNOSIS — R531 Weakness: Secondary | ICD-10-CM | POA: Diagnosis not present

## 2022-07-20 DIAGNOSIS — I672 Cerebral atherosclerosis: Secondary | ICD-10-CM | POA: Diagnosis not present

## 2022-07-20 DIAGNOSIS — E861 Hypovolemia: Secondary | ICD-10-CM | POA: Insufficient documentation

## 2022-07-20 DIAGNOSIS — I700 Atherosclerosis of aorta: Secondary | ICD-10-CM | POA: Insufficient documentation

## 2022-07-20 DIAGNOSIS — E871 Hypo-osmolality and hyponatremia: Secondary | ICD-10-CM | POA: Insufficient documentation

## 2022-07-20 DIAGNOSIS — I6521 Occlusion and stenosis of right carotid artery: Secondary | ICD-10-CM | POA: Diagnosis not present

## 2022-07-20 DIAGNOSIS — G459 Transient cerebral ischemic attack, unspecified: Secondary | ICD-10-CM | POA: Diagnosis not present

## 2022-07-20 DIAGNOSIS — R519 Headache, unspecified: Secondary | ICD-10-CM

## 2022-07-20 DIAGNOSIS — Z7984 Long term (current) use of oral hypoglycemic drugs: Secondary | ICD-10-CM | POA: Insufficient documentation

## 2022-07-20 DIAGNOSIS — E039 Hypothyroidism, unspecified: Secondary | ICD-10-CM | POA: Diagnosis not present

## 2022-07-20 DIAGNOSIS — I10 Essential (primary) hypertension: Secondary | ICD-10-CM | POA: Insufficient documentation

## 2022-07-20 DIAGNOSIS — R29818 Other symptoms and signs involving the nervous system: Secondary | ICD-10-CM | POA: Diagnosis not present

## 2022-07-20 DIAGNOSIS — Z79899 Other long term (current) drug therapy: Secondary | ICD-10-CM | POA: Diagnosis not present

## 2022-07-20 DIAGNOSIS — E785 Hyperlipidemia, unspecified: Secondary | ICD-10-CM | POA: Diagnosis present

## 2022-07-20 DIAGNOSIS — E1165 Type 2 diabetes mellitus with hyperglycemia: Secondary | ICD-10-CM | POA: Diagnosis not present

## 2022-07-20 LAB — COMPREHENSIVE METABOLIC PANEL
ALT: 13 U/L (ref 0–44)
AST: 17 U/L (ref 15–41)
Albumin: 3.9 g/dL (ref 3.5–5.0)
Alkaline Phosphatase: 50 U/L (ref 38–126)
Anion gap: 14 (ref 5–15)
BUN: 16 mg/dL (ref 8–23)
CO2: 23 mmol/L (ref 22–32)
Calcium: 9.6 mg/dL (ref 8.9–10.3)
Chloride: 95 mmol/L — ABNORMAL LOW (ref 98–111)
Creatinine, Ser: 1.11 mg/dL — ABNORMAL HIGH (ref 0.44–1.00)
GFR, Estimated: 47 mL/min — ABNORMAL LOW (ref >60)
Glucose, Bld: 135 mg/dL — ABNORMAL HIGH (ref 70–99)
Potassium: 3.5 mmol/L (ref 3.5–5.1)
Sodium: 132 mmol/L — ABNORMAL LOW (ref 135–145)
Total Bilirubin: 0.3 mg/dL (ref 0.3–1.2)
Total Protein: 7.5 g/dL (ref 6.5–8.1)

## 2022-07-20 LAB — RAPID URINE DRUG SCREEN, HOSP PERFORMED
Amphetamines: NOT DETECTED
Barbiturates: NOT DETECTED
Benzodiazepines: NOT DETECTED
Cocaine: NOT DETECTED
Opiates: NOT DETECTED
Tetrahydrocannabinol: NOT DETECTED

## 2022-07-20 LAB — DIFFERENTIAL
Abs Immature Granulocytes: 0.06 10*3/uL (ref 0.00–0.07)
Basophils Absolute: 0.1 10*3/uL (ref 0.0–0.1)
Basophils Relative: 1 %
Eosinophils Absolute: 0.2 10*3/uL (ref 0.0–0.5)
Eosinophils Relative: 1 %
Immature Granulocytes: 1 %
Lymphocytes Relative: 23 %
Lymphs Abs: 2.5 10*3/uL (ref 0.7–4.0)
Monocytes Absolute: 0.9 10*3/uL (ref 0.1–1.0)
Monocytes Relative: 8 %
Neutro Abs: 7 10*3/uL (ref 1.7–7.7)
Neutrophils Relative %: 66 %

## 2022-07-20 LAB — URINALYSIS, ROUTINE W REFLEX MICROSCOPIC
Bilirubin Urine: NEGATIVE
Glucose, UA: >=500 mg/dL — AB
Ketones, ur: NEGATIVE mg/dL
Leukocytes,Ua: NEGATIVE
Nitrite: NEGATIVE
Protein, ur: NEGATIVE mg/dL
Specific Gravity, Urine: 1.025 (ref 1.005–1.030)
pH: 7 (ref 5.0–8.0)

## 2022-07-20 LAB — I-STAT CHEM 8, ED
BUN: 17 mg/dL (ref 8–23)
Calcium, Ion: 1.19 mmol/L (ref 1.15–1.40)
Chloride: 95 mmol/L — ABNORMAL LOW (ref 98–111)
Creatinine, Ser: 1.1 mg/dL — ABNORMAL HIGH (ref 0.44–1.00)
Glucose, Bld: 113 mg/dL — ABNORMAL HIGH (ref 70–99)
HCT: 45 % (ref 36.0–46.0)
Hemoglobin: 15.3 g/dL — ABNORMAL HIGH (ref 12.0–15.0)
Potassium: 3.8 mmol/L (ref 3.5–5.1)
Sodium: 132 mmol/L — ABNORMAL LOW (ref 135–145)
TCO2: 26 mmol/L (ref 22–32)

## 2022-07-20 LAB — CBC
HCT: 42.3 % (ref 36.0–46.0)
Hemoglobin: 13.4 g/dL (ref 12.0–15.0)
MCH: 25.4 pg — ABNORMAL LOW (ref 26.0–34.0)
MCHC: 31.7 g/dL (ref 30.0–36.0)
MCV: 80.3 fL (ref 80.0–100.0)
Platelets: 223 10*3/uL (ref 150–400)
RBC: 5.27 MIL/uL — ABNORMAL HIGH (ref 3.87–5.11)
RDW: 16.3 % — ABNORMAL HIGH (ref 11.5–15.5)
WBC: 10.6 10*3/uL — ABNORMAL HIGH (ref 4.0–10.5)
nRBC: 0 % (ref 0.0–0.2)

## 2022-07-20 LAB — ETHANOL: Alcohol, Ethyl (B): <10 mg/dL (ref <10)

## 2022-07-20 LAB — APTT: aPTT: 27 seconds (ref 24–36)

## 2022-07-20 LAB — C-REACTIVE PROTEIN: CRP: 0.6 mg/dL (ref <1.0)

## 2022-07-20 LAB — PROTIME-INR
INR: 1 (ref 0.8–1.2)
Prothrombin Time: 13.4 seconds (ref 11.4–15.2)

## 2022-07-20 LAB — SEDIMENTATION RATE: Sed Rate: 15 mm/hr (ref 0–22)

## 2022-07-20 MED ORDER — ENOXAPARIN SODIUM 40 MG/0.4ML IJ SOSY
40.0000 mg | PREFILLED_SYRINGE | Freq: Every day | INTRAMUSCULAR | Status: DC
  Administered 2022-07-21: 40 mg via SUBCUTANEOUS
  Filled 2022-07-20: qty 0.4

## 2022-07-20 MED ORDER — IOHEXOL 350 MG/ML SOLN
75.0000 mL | Freq: Once | INTRAVENOUS | Status: AC | PRN
  Administered 2022-07-20: 75 mL via INTRAVENOUS

## 2022-07-20 MED ORDER — DIPHENHYDRAMINE HCL 50 MG/ML IJ SOLN
25.0000 mg | Freq: Once | INTRAMUSCULAR | Status: AC
  Administered 2022-07-20: 25 mg via INTRAVENOUS
  Filled 2022-07-20: qty 1

## 2022-07-20 MED ORDER — METOCLOPRAMIDE HCL 5 MG/ML IJ SOLN
10.0000 mg | Freq: Once | INTRAMUSCULAR | Status: AC
  Administered 2022-07-20: 10 mg via INTRAVENOUS
  Filled 2022-07-20: qty 2

## 2022-07-20 NOTE — H&P (Addendum)
History and Physical  Susan Randall UEA:[id number] DOB: [birth year] DOA: 07/20/2022  Referring physician: Dr. Hermelinda Dellen, Resident, EDP  PCP: Renaye Rakers, MD  Outpatient Specialists: ENT, vascular surgery Patient coming from: Home.  Chief Complaint: Headache and one-sided weakness.  HPI: Susan Randall is a 89 y.o. female with medical history significant for hypertension, hyperlipidemia, DM2, hypothyroidism, who presents with complaints of headache from the back of the head and migrating to the front of the head, worse at the left temporal region.  Also endorses one-sided weakness.  Initially told EDP that she had left-sided weakness and later was noted to have right-sided weakness.  She told the writer that her weakness was improved.  Improvement of her strength was also noted on exam.  Last known well was around 1830.  Code stroke was activated in the ED.  Non-contrast CT head and CT angio head and neck were nonacute.  The patient is not a candidate for thrombolytics or IR intervention.  The patient was seen by neurology/stroke team.  Due to concern for possible giant cell arteritis inflammatory markers were obtained and returned negative, CRP and sed rate were within normal range.    TRH, hospitalist service, was asked to admit for TIA/stroke workup.  Admitted to telemetry medical unit as observation status.  ED Course: Temperature 97.9.  BP 149/73, pulse 77, respiration rate 20, O2 saturation 100% on room air.  UA negative for pyuria.  Lab studies notable for serum sodium 132, creatinine 1.10 with GFR 47.  WBC 10.6, hemoglobin 13.4, platelet count 223.  Review of Systems: Review of systems as noted in the HPI. All other systems reviewed and are negative.   Past Medical History:  Diagnosis Date   Anxiety    Arthritis    Breast density    right   Cholesterol retinal embolus    Hyperlipidemia    Hypertension    Insomnia 07/07/2007   Lipid disorder    Obesity    Osteopenia     Osteoporosis    Past Surgical History:  Procedure Laterality Date   APPENDECTOMY  1944    Social History:  reports that she has never smoked. She has never used smokeless tobacco. She reports current alcohol use of about 3.0 - 4.0 standard drinks of alcohol per week. She reports that she does not use drugs.   No Known Allergies  Family History  Problem Relation Age of Onset   Diabetes Mother    Heart disease Mother    Hypertension Mother       Prior to Admission medications   Medication Sig Start Date End Date Taking? Authorizing Provider  Cholecalciferol (VITAMIN D3) 1.25 MG (50000 UT) CAPS Take 1 capsule by mouth once a week. 11/30/20  Yes [provider]  diltiazem (TIAZAC) 180 MG 24 hr capsule Take 180 mg by mouth daily.   Yes [provider]  fexofenadine (ALLEGRA) 180 MG tablet Take 180 mg by mouth daily.   Yes [provider]  levothyroxine (SYNTHROID) 50 MCG tablet Take 1 tablet by mouth daily. 04/03/16  Yes [provider]  LINZESS 290 MCG CAPS capsule Take 290 mcg by mouth daily. 11/24/20  Yes [provider]  montelukast (SINGULAIR) 10 MG tablet Take 1 tablet by mouth at bedtime. 07/03/21  Yes [provider]  omega-3 acid ethyl esters (LOVAZA) 1 g capsule Take 1 g by mouth 2 (two) times daily.   Yes [provider]  simvastatin (ZOCOR) 40 MG tablet Take 40 mg  by mouth every evening.   Yes [provider]  SitaGLIPtin-MetFORMIN HCl (JANUMET XR) 50-500 MG TB24 Take 1 tablet by mouth in the morning and at bedtime.   Yes [provider]  valsartan-hydrochlorothiazide (DIOVAN-HCT) 160-25 MG tablet Take 1 tablet by mouth daily.   Yes [provider]    Physical Exam: BP (!) 149/73 (BP Location: Right Arm)   Pulse 71   Temp 97.9 F (36.6 C) (Oral)   Resp 19   Ht 5\' 3"  (1.6 m)   Wt 87.5 kg   SpO2 100%   BMI 34.19 kg/m   General: 89 y.o. year-old female well developed well  nourished in no acute distress.  Alert and interactive.  Mild tenderness with palpation of left temporal region. Cardiovascular: Regular rate and rhythm with no rubs or gallops.  No thyromegaly or JVD noted.  No lower extremity edema. 2/4 pulses in all 4 extremities. Respiratory: Clear to auscultation with no wheezes or rales. Good inspiratory effort. Abdomen: Soft nontender nondistended with normal bowel sounds x4 quadrants. Muskuloskeletal: No cyanosis, clubbing or edema noted bilaterally Neuro: CN II-XII intact, strength, sensation, reflexes Skin: No ulcerative lesions noted or rashes Psychiatry: Judgement and insight appear normal. Mood is appropriate for condition and setting          Labs on Admission:  Basic Metabolic Panel: Recent Labs  Lab 07/20/22 2157 07/20/22 2243  NA 132* 132*  K 3.5 3.8  CL 95* 95*  CO2 23  --   GLUCOSE 135* 113*  BUN 16 17  CREATININE 1.11* 1.10*  CALCIUM 9.6  --    Liver Function Tests: Recent Labs  Lab 07/20/22 2157  AST 17  ALT 13  ALKPHOS 50  BILITOT 0.3  PROT 7.5  ALBUMIN 3.9   No results for input(s): "LIPASE", "AMYLASE" in the last 168 hours. No results for input(s): "AMMONIA" in the last 168 hours. CBC: Recent Labs  Lab 07/20/22 2157 07/20/22 2243  WBC 10.6*  --   NEUTROABS 7.0  --   HGB 13.4 15.3*  HCT 42.3 45.0  MCV 80.3  --   PLT 223  --    Cardiac Enzymes: No results for input(s): "CKTOTAL", "CKMB", "CKMBINDEX", "TROPONINI" in the last 168 hours.  BNP (last 3 results) No results for input(s): "BNP" in the last 8760 hours.  ProBNP (last 3 results) No results for input(s): "PROBNP" in the last 8760 hours.  CBG: No results for input(s): "GLUCAP" in the last 168 hours.  Radiological Exams on Admission: CT ANGIO HEAD NECK W WO CM (CODE STROKE)  Result Date: 07/20/2022 CLINICAL DATA:  Headache, weakness, stroke suspected EXAM: CT ANGIOGRAPHY HEAD AND NECK WITH AND WITHOUT CONTRAST TECHNIQUE: Multidetector CT  imaging of the head and neck was performed using the standard protocol during bolus administration of intravenous contrast. Multiplanar CT image reconstructions and MIPs were obtained to evaluate the vascular anatomy. Carotid stenosis measurements (when applicable) are obtained utilizing NASCET criteria, using the distal internal carotid diameter as the denominator. RADIATION DOSE REDUCTION: This exam was performed according to the departmental dose-optimization program which includes automated exposure control, adjustment of the mA and/or kV according to patient size and/or use of iterative reconstruction technique. CONTRAST:  75mL OMNIPAQUE IOHEXOL 350 MG/ML SOLN COMPARISON:  No prior CTA available, correlation is made with 07/20/2022 CT head FINDINGS: CT HEAD FINDINGS For noncontrast findings, please see same day CT head. CTA NECK FINDINGS Aortic arch: Standard branching. Imaged portion shows no evidence of aneurysm or dissection.  No significant stenosis of the major arch vessel origins. Aortic atherosclerosis. Right carotid system: No evidence of dissection, occlusion, or hemodynamically significant stenosis (greater than 50%). Atherosclerotic disease at the bifurcation and in the proximal ICA is not hemodynamically significant. Beading in the mid right ICA (series 8, image 153). Left carotid system: No evidence of dissection, occlusion, or hemodynamically significant stenosis (greater than 50%). Beading in the mid left ICA (series 8, image 149). Vertebral arteries: No evidence of dissection, occlusion, or hemodynamically significant stenosis (greater than 50%). Skeleton: No acute osseous abnormality. Degenerative changes in the cervical spine. Other neck: No acute finding. Upper chest: No focal pulmonary opacity or pleural effusion. Review of the MIP images confirms the above findings CTA HEAD FINDINGS Anterior circulation: Both internal carotid arteries are patent to the termini, where calcifications but  without significant stenosis. A1 segments patent. Normal anterior communicating artery. Anterior cerebral arteries are patent to their distal aspects without significant stenosis. No M1 stenosis or occlusion. MCA branches perfused to their distal aspects without significant stenosis. Posterior circulation: Vertebral arteries patent to the vertebrobasilar junction without significant stenosis. Posterior inferior cerebellar arteries patent proximally. Basilar patent to its distal aspect without significant stenosis. Superior cerebellar arteries patent proximally. Patent P1 segments. PCAs perfused to their distal aspects without significant stenosis. The bilateral posterior communicating arteries are not visualized. Venous sinuses: As permitted by contrast timing, patent. Anatomic variants: None significant. Review of the MIP images confirms the above findings IMPRESSION: 1. No intracranial large vessel occlusion or significant stenosis. 2. No hemodynamically significant stenosis in the neck. 3. Beading in the bilateral mid ICAs, which could represent fibromuscular dysplasia. 4. Aortic atherosclerosis. Aortic Atherosclerosis (ICD10-I70.0). Imaging results were communicated on 07/20/2022 at 10:28 pm to provider Dr. Wilford Corner via secure text paging. Electronically Signed   By: Wiliam Ke M.D.   On: 07/20/2022 22:28   CT HEAD CODE STROKE WO CONTRAST  Result Date: 07/20/2022 CLINICAL DATA:  Code stroke.  Weakness EXAM: CT HEAD WITHOUT CONTRAST TECHNIQUE: Contiguous axial images were obtained from the base of the skull through the vertex without intravenous contrast. RADIATION DOSE REDUCTION: This exam was performed according to the departmental dose-optimization program which includes automated exposure control, adjustment of the mA and/or kV according to patient size and/or use of iterative reconstruction technique. COMPARISON:  08/09/2014 CT head FINDINGS: Brain: Possible loss of the right insular ribbon (series 2,  image 13) is favored to be artifactual, secondary to beam hardening artifact from the patient's calvarium, given the appearance the sagittal (series 6, image 18). No evidence of acute infarction, hemorrhage, mass, mass effect, or midline shift. No hydrocephalus or extra-axial collection. Vascular: No hyperdense vessel. Atherosclerotic calcifications in the intracranial carotid and vertebral arteries. Skull: Negative for fracture or focal lesion. Sinuses/Orbits: No acute finding. Status post bilateral lens replacements. Other: The mastoid air cells are well aerated. ASPECTS ([place] Stroke Program Early CT Score) - Ganglionic level infarction (caudate, lentiform nuclei, internal capsule, insula, M1-M3 cortex): 6 - Supraganglionic infarction (M4-M6 cortex): 3 Total score (0-10 with 10 being normal): 9 IMPRESSION: Possible loss of the right insular ribbon, which is favored to be artifactual. No additional acute intracranial process. ASPECTS is 9. Imaging results were communicated on 07/20/2022 at 10:07 pm to provider Dr. Wilford Corner via secure text paging. Electronically Signed   By: Wiliam Ke M.D.   On: 07/20/2022 22:07    EKG: I independently viewed the EKG done and my findings are as followed:    Assessment/Plan Present on Admission:  Stroke-like symptoms  Principal Problem:   Stroke-like symptoms  Strokelike symptoms Right-sided focal weakness Headache, left temporal pain, and tenderness to palpation. ESR and CRP negative Last known well around 1830, symptoms have improved Admitted for TIA/stroke workup Obtain noncontrast MRI brain 2D echo Fasting lipid panel and A1c Neurochecks Permissive hypertension, treat SBP greater than 220 or DBP greater than 120. IV labetalol as needed with parameters PT OT speech therapist evaluation Start aspirin 81 mg daily and high intensity statin as recommended by neurology Appreciate neurology/stroke team assistance  Mild hypovolemic hyponatremia Serum sodium  132 Encourage oral protein calorie intake Gentle IV fluid hydration NS at 50 cc/h x 1 day Repeat BMP in the morning  Type 2 diabetes with hyperglycemia Obtain hemoglobin A1c Heart healthy carb modified diet after the patient passes a swallow evaluation at bedside Start insulin sliding scale.  Hyperlipidemia Goal LDL less than 70 Started on Lipitor 40 mg daily  Hypothyroidism Resume home levothyroxine  Hypertension Ongoing permissive hypertension Monitor vital signs  Obesity BMI 34 Recommend regular physical activity and healthy dieting.    Time: 75 minutes.    DVT prophylaxis: Subcu Lovenox daily  Code Status: Full code  Family Communication: A friend of 7 years at bedside  Disposition Plan: Admitted to med telemetry medical unit   Consults called: Neurology/stroke team  Admission status: Observation status   Status is: Observation    Darlin Drop MD [facility] Pager [phone]  If 7PM-7AM, please contact night-coverage [email or web address] Password [facility]  07/20/2022, 11:58 PM

## 2022-07-20 NOTE — Code Documentation (Signed)
Stroke Response Nurse Documentation Code Documentation  JHAVIA REVOIR is a 89 y.o. female arriving to [facility]  via [employer] on 7/14 with past medical hx of HLD, HTN, Osteoporosis. On No antithrombotic. Code stroke was activated by ED.   Patient from home where she was LKW at 1830 and now complaining of headache and weakness.   Stroke team at the bedside on patient arrival. Labs drawn and patient cleared for CT by Dr. Willeen Cass. Patient to CT with team. NIHSS 0, see documentation for details and code stroke times. Patient with no focal deficits on exam. The following imaging was completed:  CT Head and CTA. Patient is not a candidate for IV Thrombolytic due to too mild to treat. Patient is not a candidate for IR due to No LVO.     Bedside handoff with ED RN Victorino Dike.    Rose Fillers  Rapid Response RN

## 2022-07-20 NOTE — H&P (Incomplete)
History and Physical  Susan Randall UEA:[id number] DOB: [birth year] DOA: 07/20/2022  Referring physician: Dr. Hermelinda Dellen, Resident, EDP  PCP: Renaye Rakers, MD  Outpatient Specialists: ENT, vascular surgery Patient coming from: Home.  Chief Complaint: Headache and one-sided weakness.  HPI: Susan Randall is a 89 y.o. female with medical history significant for hypertension, hyperlipidemia, osteoporosis, who presents with complaints of headache from the back of the head migrated to the front of the head was seen at the left temporal region.  Also endorses one-sided weakness initially told EDP that she had left-sided weakness and later told neurologist that she had right-sided weakness.  She told the writer that her weaknesses were improved.  Code stroke was activated in the ED. the patient was seen by neurology/stroke team.  Due to concern for possible giant cell arteritis inflammatory markers were obtained and returned negative, CRP and sed rate were within normal range.  ED Course: ***  Review of Systems: Review of systems as noted in the HPI. All other systems reviewed and are negative.   Past Medical History:  Diagnosis Date  . Anxiety   . Arthritis   . Breast density    right  . Cholesterol retinal embolus   . Hyperlipidemia   . Hypertension   . Insomnia 07/07/2007  . Lipid disorder   . Obesity   . Osteopenia   . Osteoporosis    Past Surgical History:  Procedure Laterality Date  . APPENDECTOMY  1944    Social History:  reports that she has never smoked. She has never used smokeless tobacco. She reports current alcohol use of about 3.0 - 4.0 standard drinks of alcohol per week. She reports that she does not use drugs.   No Known Allergies  Family History  Problem Relation Age of Onset  . Diabetes Mother   . Heart disease Mother   . Hypertension Mother     ***  Prior to Admission medications   Medication Sig Start Date End Date Taking? Authorizing Provider   Cholecalciferol (VITAMIN D3) 1.25 MG (50000 UT) CAPS Take 1 capsule by mouth once a week. 11/30/20  Yes [provider]  diltiazem (TIAZAC) 180 MG 24 hr capsule Take 180 mg by mouth daily.   Yes [provider]  fexofenadine (ALLEGRA) 180 MG tablet Take 180 mg by mouth daily.   Yes [provider]  levothyroxine (SYNTHROID) 50 MCG tablet Take 1 tablet by mouth daily. 04/03/16  Yes [provider]  LINZESS 290 MCG CAPS capsule Take 290 mcg by mouth daily. 11/24/20  Yes [provider]  montelukast (SINGULAIR) 10 MG tablet Take 1 tablet by mouth at bedtime. 07/03/21  Yes [provider]  omega-3 acid ethyl esters (LOVAZA) 1 g capsule Take 1 g by mouth 2 (two) times daily.   Yes [provider]  simvastatin (ZOCOR) 40 MG tablet Take 40 mg by mouth every evening.   Yes [provider]  SitaGLIPtin-MetFORMIN HCl (JANUMET XR) 50-500 MG TB24 Take 1 tablet by mouth in the morning and at bedtime.   Yes [provider]  valsartan-hydrochlorothiazide (DIOVAN-HCT) 160-25 MG tablet Take 1 tablet by mouth daily.   Yes [provider]    Physical Exam: BP (!) 149/73 (BP Location: Right Arm)   Pulse 71   Temp 97.9 F (36.6 C) (Oral)   Resp 19   Ht 5\' 3"  (1.6 m)   Wt 87.5 kg   SpO2 100%   BMI 34.19 kg/m   General: 89  y.o. year-old female well developed well nourished in no acute distress.  Alert and oriented x3. Cardiovascular: Regular rate and rhythm with no rubs or gallops.  No thyromegaly or JVD noted.  No lower extremity edema. 2/4 pulses in all 4 extremities. Respiratory: Clear to auscultation with no wheezes or rales. Good inspiratory effort. Abdomen: Soft nontender nondistended with normal bowel sounds x4 quadrants. Muskuloskeletal: No cyanosis, clubbing or edema noted bilaterally Neuro: CN II-XII intact, strength, sensation, reflexes Skin: No ulcerative lesions noted or rashes Psychiatry: Judgement and  insight appear normal. Mood is appropriate for condition and setting          Labs on Admission:  Basic Metabolic Panel: Recent Labs  Lab 07/20/22 2157 07/20/22 2243  NA 132* 132*  K 3.5 3.8  CL 95* 95*  CO2 23  --   GLUCOSE 135* 113*  BUN 16 17  CREATININE 1.11* 1.10*  CALCIUM 9.6  --    Liver Function Tests: Recent Labs  Lab 07/20/22 2157  AST 17  ALT 13  ALKPHOS 50  BILITOT 0.3  PROT 7.5  ALBUMIN 3.9   No results for input(s): "LIPASE", "AMYLASE" in the last 168 hours. No results for input(s): "AMMONIA" in the last 168 hours. CBC: Recent Labs  Lab 07/20/22 2157 07/20/22 2243  WBC 10.6*  --   NEUTROABS 7.0  --   HGB 13.4 15.3*  HCT 42.3 45.0  MCV 80.3  --   PLT 223  --    Cardiac Enzymes: No results for input(s): "CKTOTAL", "CKMB", "CKMBINDEX", "TROPONINI" in the last 168 hours.  BNP (last 3 results) No results for input(s): "BNP" in the last 8760 hours.  ProBNP (last 3 results) No results for input(s): "PROBNP" in the last 8760 hours.  CBG: No results for input(s): "GLUCAP" in the last 168 hours.  Radiological Exams on Admission: CT ANGIO HEAD NECK W WO CM (CODE STROKE)  Result Date: 07/20/2022 CLINICAL DATA:  Headache, weakness, stroke suspected EXAM: CT ANGIOGRAPHY HEAD AND NECK WITH AND WITHOUT CONTRAST TECHNIQUE: Multidetector CT imaging of the head and neck was performed using the standard protocol during bolus administration of intravenous contrast. Multiplanar CT image reconstructions and MIPs were obtained to evaluate the vascular anatomy. Carotid stenosis measurements (when applicable) are obtained utilizing NASCET criteria, using the distal internal carotid diameter as the denominator. RADIATION DOSE REDUCTION: This exam was performed according to the departmental dose-optimization program which includes automated exposure control, adjustment of the mA and/or kV according to patient size and/or use of iterative reconstruction technique.  CONTRAST:  75mL OMNIPAQUE IOHEXOL 350 MG/ML SOLN COMPARISON:  No prior CTA available, correlation is made with 07/20/2022 CT head FINDINGS: CT HEAD FINDINGS For noncontrast findings, please see same day CT head. CTA NECK FINDINGS Aortic arch: Standard branching. Imaged portion shows no evidence of aneurysm or dissection. No significant stenosis of the major arch vessel origins. Aortic atherosclerosis. Right carotid system: No evidence of dissection, occlusion, or hemodynamically significant stenosis (greater than 50%). Atherosclerotic disease at the bifurcation and in the proximal ICA is not hemodynamically significant. Beading in the mid right ICA (series 8, image 153). Left carotid system: No evidence of dissection, occlusion, or hemodynamically significant stenosis (greater than 50%). Beading in the mid left ICA (series 8, image 149). Vertebral arteries: No evidence of dissection, occlusion, or hemodynamically significant stenosis (greater than 50%). Skeleton: No acute osseous abnormality. Degenerative changes in the cervical spine. Other neck: No acute finding. Upper chest: No focal pulmonary opacity or pleural effusion.  Review of the MIP images confirms the above findings CTA HEAD FINDINGS Anterior circulation: Both internal carotid arteries are patent to the termini, where calcifications but without significant stenosis. A1 segments patent. Normal anterior communicating artery. Anterior cerebral arteries are patent to their distal aspects without significant stenosis. No M1 stenosis or occlusion. MCA branches perfused to their distal aspects without significant stenosis. Posterior circulation: Vertebral arteries patent to the vertebrobasilar junction without significant stenosis. Posterior inferior cerebellar arteries patent proximally. Basilar patent to its distal aspect without significant stenosis. Superior cerebellar arteries patent proximally. Patent P1 segments. PCAs perfused to their distal aspects  without significant stenosis. The bilateral posterior communicating arteries are not visualized. Venous sinuses: As permitted by contrast timing, patent. Anatomic variants: None significant. Review of the MIP images confirms the above findings IMPRESSION: 1. No intracranial large vessel occlusion or significant stenosis. 2. No hemodynamically significant stenosis in the neck. 3. Beading in the bilateral mid ICAs, which could represent fibromuscular dysplasia. 4. Aortic atherosclerosis. Aortic Atherosclerosis (ICD10-I70.0). Imaging results were communicated on 07/20/2022 at 10:28 pm to provider Dr. Wilford Corner via secure text paging. Electronically Signed   By: Wiliam Ke M.D.   On: 07/20/2022 22:28   CT HEAD CODE STROKE WO CONTRAST  Result Date: 07/20/2022 CLINICAL DATA:  Code stroke.  Weakness EXAM: CT HEAD WITHOUT CONTRAST TECHNIQUE: Contiguous axial images were obtained from the base of the skull through the vertex without intravenous contrast. RADIATION DOSE REDUCTION: This exam was performed according to the departmental dose-optimization program which includes automated exposure control, adjustment of the mA and/or kV according to patient size and/or use of iterative reconstruction technique. COMPARISON:  08/09/2014 CT head FINDINGS: Brain: Possible loss of the right insular ribbon (series 2, image 13) is favored to be artifactual, secondary to beam hardening artifact from the patient's calvarium, given the appearance the sagittal (series 6, image 18). No evidence of acute infarction, hemorrhage, mass, mass effect, or midline shift. No hydrocephalus or extra-axial collection. Vascular: No hyperdense vessel. Atherosclerotic calcifications in the intracranial carotid and vertebral arteries. Skull: Negative for fracture or focal lesion. Sinuses/Orbits: No acute finding. Status post bilateral lens replacements. Other: The mastoid air cells are well aerated. ASPECTS (Alberta Stroke Program Early CT Score) -  Ganglionic level infarction (caudate, lentiform nuclei, internal capsule, insula, M1-M3 cortex): 6 - Supraganglionic infarction (M4-M6 cortex): 3 Total score (0-10 with 10 being normal): 9 IMPRESSION: Possible loss of the right insular ribbon, which is favored to be artifactual. No additional acute intracranial process. ASPECTS is 9. Imaging results were communicated on 07/20/2022 at 10:07 pm to provider Dr. Wilford Corner via secure text paging. Electronically Signed   By: Wiliam Ke M.D.   On: 07/20/2022 22:07    EKG: I independently viewed the EKG done and my findings are as followed: ***   Assessment/Plan Present on Admission: . Stroke-like symptoms  Principal Problem:   Stroke-like symptoms   DVT prophylaxis: ***   Code Status: ***   Family Communication: ***   Disposition Plan: ***   Consults called: ***   Admission status: ***    Status is: Observation {Observation:23811}   Darlin Drop MD [facility] Pager [phone]  If 7PM-7AM, please contact night-coverage [email or web address] Password [facility]  07/20/2022, 11:58 PM

## 2022-07-20 NOTE — ED Triage Notes (Signed)
Pt c/o sharp pain in the back of her head x 3 hours. States that she has had similar episodes before but they would last a few minutes. Pt in NAD at time of triage.

## 2022-07-20 NOTE — Consult Note (Signed)
Neurology Consultation  Reason for Consult: Code stroke headache and weakness Referring Physician: Dr. Chaney Malling  CC: Headache, weakness  History is obtained from: Patient, chart  HPI: Susan Randall is a 89 y.o. female past medical history of hypertension, hyperlipidemia, obesity, prior history of cholesterol retinal embolus with no residual deficits had sudden onset of headache around 2 PM with some blurred vision and a few hours later had some weakness which initially was reported to be right-sided weakness but she reports generalized weakness.  Last known well was reported to be 6:30 PM per patient report but it is unclear what had really changed at that time.  She reports that the weakness-which is generalized started right around 6:30 PM, hence last known well of 6:30 PM.  She reports a headache to be on the side and back of the head on the left side with almost sharp piercing quality.  She has not had these current headaches before.  No jaw claudication at this time but did report blurred vision in the left eye at that time.  Reports the headache is much better and the eye symptoms are also much improved. Code stroke was activated due to initial report of right-sided weakness focality. Patient denies right-sided symptoms and on exam also did not have any focality to the right.   LKW: 6:30 PM-although unclear IV thrombolysis given?: no, too mild to treat, low suspicion for stroke EVT: No ELVO Premorbid modified Rankin scale (mRS):0   ROS: Full ROS was performed and is negative except as noted in the HPI.   Past Medical History:  Diagnosis Date   Anxiety    Arthritis    Breast density    right   Cholesterol retinal embolus    Hyperlipidemia    Hypertension    Insomnia 07/07/2007   Lipid disorder    Obesity    Osteopenia    Osteoporosis      Family History  Problem Relation Age of Onset   Diabetes Mother    Heart disease Mother    Hypertension Mother      Social  History:   reports that she has never smoked. She has never used smokeless tobacco. She reports current alcohol use of about 3.0 - 4.0 standard drinks of alcohol per week. She reports that she does not use drugs.  Medications  Current Facility-Administered Medications:    diphenhydrAMINE (BENADRYL) injection 25 mg, 25 mg, Intravenous, Once, Charlynne Pander, MD   metoCLOPramide [facility]) injection 10 mg, 10 mg, Intravenous, Once, Charlynne Pander, MD  Current Outpatient Medications:    Cholecalciferol (VITAMIN D3) 1.25 MG (50000 UT) CAPS, Take 1 capsule by mouth once a week., Disp: , Rfl:    diltiazem (TIAZAC) 180 MG 24 hr capsule, Take 180 mg by mouth daily., Disp: , Rfl:    fexofenadine (ALLEGRA) 60 MG tablet, Take by mouth., Disp: , Rfl:    levothyroxine (SYNTHROID) 50 MCG tablet, Take 1 tablet by mouth daily., Disp: , Rfl:    LINZESS 290 MCG CAPS capsule, Take 290 mcg by mouth daily., Disp: , Rfl:    omega-3 acid ethyl esters (LOVAZA) 1 g capsule, Take 1 g by mouth 2 (two) times daily., Disp: , Rfl:    simvastatin (ZOCOR) 40 MG tablet, Take 40 mg by mouth every evening., Disp: , Rfl:    SitaGLIPtin-MetFORMIN HCl (JANUMET XR) 50-500 MG TB24, Janumet XR 50 mg-500 mg tablet,extended release  TAKE 2 TABLETS BY MOUTH EVERY DAY, Disp: , Rfl:  valsartan-hydrochlorothiazide (DIOVAN-HCT) 160-25 MG tablet, Take 1 tablet by mouth daily., Disp: , Rfl:    Exam: Current vital signs: BP (!) 155/80   Pulse 85   Temp 97.9 F (36.6 C) (Oral)   Resp 16   Ht 5\' 3"  (1.6 m)   Wt 87.5 kg   SpO2 99%   BMI 34.19 kg/m  Vital signs in last 24 hours: Temp:  [97.9 F (36.6 C)] 97.9 F (36.6 C) (07/14 2012) Pulse Rate:  [85] 85 (07/14 2012) Resp:  [16] 16 (07/14 2012) BP: (155)/(80) 155/80 (07/14 2012) SpO2:  [99 %] 99 % (07/14 2012) Weight:  [87.5 kg] 87.5 kg (07/14 2013) General: Awake alert in no distress.  Looks much younger than stated age of 89 years old. HEENT: Normocephalic atraumatic,  intact temporal pulses bilaterally. Lungs: Clear Cardiovascular: Regular rhythm Abdomen nondistended nontender Extremities warm well-perfused Neurological exam Awake alert oriented x 3, no dysarthria, no aphasia. Cranial nerves II to XII intact Motor examination with no drift in any of the 4 extremities Sensation intact light touch Coordination examination with no dysmetria. NIH stroke scale 0  Labs I have reviewed labs in epic and the results pertinent to this consultation are:  CBC    Component Value Date/Time   WBC 10.6 (H) 07/20/2022 2157   RBC 5.27 (H) 07/20/2022 2157   HGB 13.4 07/20/2022 2157   HCT 42.3 07/20/2022 2157   PLT 223 07/20/2022 2157   MCV 80.3 07/20/2022 2157   MCH 25.4 (L) 07/20/2022 2157   MCHC 31.7 07/20/2022 2157   RDW 16.3 (H) 07/20/2022 2157   LYMPHSABS 2.5 07/20/2022 2157   MONOABS 0.9 07/20/2022 2157   EOSABS 0.2 07/20/2022 2157   BASOSABS 0.1 07/20/2022 2157    CMP     Component Value Date/Time   NA 137 08/08/2014 2335   K 3.7 08/08/2014 2335   CL 103 08/08/2014 2335   CO2 26 08/08/2014 2335   GLUCOSE 179 (H) 08/08/2014 2335   BUN 14 08/08/2014 2335   CREATININE 1.00 08/08/2014 2335   CALCIUM 9.0 08/08/2014 2335   PROT 6.7 08/08/2014 2335   ALBUMIN 3.9 08/08/2014 2335   AST 25 08/08/2014 2335   ALT 15 08/08/2014 2335   ALKPHOS 61 08/08/2014 2335   BILITOT 0.4 08/08/2014 2335   GFRNONAA 51 (L) 08/08/2014 2335   GFRAA 59 (L) 08/08/2014 2335   Imaging I have reviewed the images obtained:  CT-head-no acute changes CTA head and neck:: No intracranial large vessel occlusion or significant stenosis.  Question FMD in bilateral mid ICAs.  Assessment:  89 year old with above past medical history that includes cholesterol levels to the right neck some point with no residual deficits with sudden onset of headache that started around 2 PM along with some blurred vision and then noted some weakness at 6:30 PM-for which a last known well of  6:30 PM was presumed and a code stroke activated.  Initial report of some right-sided weakness but she reports generalized weakness. On my examination also she has no focality to the right. At this point, given her prior history of cholesterol embolus to the eye, retinal artery occlusion can be in the differentials as well as giant cell arteritis. Her symptoms are too mild to treat.  She has no residual visual symptoms at this time.  If this were to be considered as ophthalmic stroke/TIA-she is way outside the window since her headache and blurred vision started at 2 PM.  Impression: Ophthalmic TIA versus CRAO versus  GCA.  Recommendations: Recommend admission to hospitalist Frequent neurochecks Telemetry Aspirin 81 High intensity statin MRI brain without contrast-ordered stat I would also check ESR and CRP-if raised, will treat with steroids and consult vascular surgery for temporal artery biopsy for giant cell arteritis. 2D echo A1c Lipid panel PT OT speech therapy Allow for permissive hypertension for now-treat only if systolic blood pressures greater than 220 on a as needed basis. Plan was discussed with Dr. Silverio Lay in the ED. Stroke team will continue to follow.   -- Milon Dikes, MD Neurologist [facility] Pager: [phone]

## 2022-07-20 NOTE — ED Provider Notes (Signed)
EMERGENCY DEPARTMENT AT [facility] Provider Note  MDM   HPI/ROS:  Susan Randall is a 89 y.o. female with a medical history as below who presents for evaluation of headache.  Reports that she had sudden onset of headache on the left side of her head which started in the back in the occipital region.  It has since worsened and has migrated to the front temporal region.  She reports that her left eye feels "tired."  States that she feels weak on the right.  In the left side of her face feels numb.  Physical exam is notable for: - Right-sided weakness - Left-sided facial sensation change - No facial droop or slurred speech  On my initial evaluation, patient is:  -Vital signs stable. Patient afebrile, hemodynamically stable, and non-toxic appearing. -Additional history obtained from chart review  This patient's current presentation, including their history and physical exam, is most consistent with CVA.  Neurology was consulted and a code stroke was called.  ABCs were intact.  Interpretations, interventions, and the patient's course of care are documented below.    Clinical Course as of 07/20/22 2254  Susan Randall Jul 20, 2022  2252 Spoke with hospitalist who graciously agreed to admit the patient for TIA workup, at the request of the neuro consultant.  [BB]  2252 CT HEAD CODE STROKE WO CONTRAST No evidence of ICH [BB]  2252 CT ANGIO HEAD NECK W WO CM (CODE STROKE) No evidence of LVO [BB]    Clinical Course User Index [BB] Fayrene Helper, MD     Disposition:  I discussed the case with hospitalist and neurologist who graciously agreed to admit the patient to their service for continued care.   Clinical Impression:  1. Stroke-like symptoms   2. Acute nonintractable headache, unspecified headache type     Rx / DC Orders ED Discharge Orders     None       The plan for this patient was discussed with Dr. Silverio Lay, who voiced agreement and who oversaw evaluation and  treatment of this patient.   Clinical Complexity A medically appropriate history, review of systems, and physical exam was performed.  My independent interpretations of EKG, labs, and radiology are documented in the ED course above.   If decision rules were used in this patient's evaluation, they are listed below.   Click here for ABCD2, HEART and other calculatorsREFRESH Note before signing   Patient's presentation is most consistent with acute presentation with potential threat to life or bodily function.  Medical Decision Making Amount and/or Complexity of Data Reviewed External Data Reviewed: notes.    Details: Outpatient visit, and ED visit from 2022 Labs: ordered. Radiology: ordered and independent interpretation performed. Decision-making details documented in ED Course. ECG/medicine tests: ordered.  Risk OTC drugs. Prescription drug management. Decision regarding hospitalization.    HPI/ROS      See MDM section for pertinent HPI and ROS. A complete ROS was performed with pertinent positives/negatives noted above.   Past Medical History:  Diagnosis Date   Anxiety    Arthritis    Breast density    right   Cholesterol retinal embolus    Hyperlipidemia    Hypertension    Insomnia 07/07/2007   Lipid disorder    Obesity    Osteopenia    Osteoporosis     Past Surgical History:  Procedure Laterality Date   APPENDECTOMY  1944      Physical Exam   Vitals:   07/20/22 2012  07/20/22 2013 07/20/22 2229  BP: (!) 155/80  (!) 149/73  Pulse: 85  77  Resp: 16  20  Temp: 97.9 F (36.6 C)    TempSrc: Oral    SpO2: 99%  100%  Weight:  87.5 kg   Height:  5\' 3"  (1.6 m)     Physical Exam Vitals and nursing note reviewed.  Constitutional:      General: She is not in acute distress.    Appearance: She is not ill-appearing or toxic-appearing.  HENT:     Head: Normocephalic and atraumatic.     Mouth/Throat:     Mouth: Mucous membranes are moist.  Eyes:      Extraocular Movements: Extraocular movements intact.     Pupils: Pupils are equal, round, and reactive to light.  Cardiovascular:     Rate and Rhythm: Normal rate and regular rhythm.     Heart sounds: Normal heart sounds.  Pulmonary:     Effort: Pulmonary effort is normal.     Breath sounds: Normal breath sounds.  Abdominal:     General: There is no distension.     Palpations: Abdomen is soft.     Tenderness: There is no abdominal tenderness. There is no guarding.  Musculoskeletal:     Cervical back: Normal range of motion.  Skin:    General: Skin is warm and dry.     Capillary Refill: Capillary refill takes less than 2 seconds.  Neurological:     Mental Status: She is alert and oriented to person, place, and time.     GCS: GCS eye subscore is 4. GCS verbal subscore is 5. GCS motor subscore is 6.     Cranial Nerves: No cranial nerve deficit.     Sensory: Sensory deficit (left sided facial numbness / tingling) present.     Motor: Weakness (right sided weakness) present.     Coordination: Romberg sign negative. Coordination normal.  Psychiatric:        Speech: Speech normal.      Procedures   If procedures were preformed on this patient, they are listed below:  Procedures   Fayrene Helper, MD Emergency Medicine PGY-2   Please note that this documentation was produced with the assistance of voice-to-text technology and may contain errors.    Fayrene Helper, MD 07/20/22 2256    Charlynne Pander, MD 07/22/22 [phone]

## 2022-07-21 ENCOUNTER — Observation Stay (HOSPITAL_BASED_OUTPATIENT_CLINIC_OR_DEPARTMENT_OTHER): Payer: Medicare Other

## 2022-07-21 ENCOUNTER — Observation Stay (HOSPITAL_COMMUNITY): Payer: Medicare Other

## 2022-07-21 DIAGNOSIS — R29818 Other symptoms and signs involving the nervous system: Secondary | ICD-10-CM | POA: Diagnosis not present

## 2022-07-21 DIAGNOSIS — E785 Hyperlipidemia, unspecified: Secondary | ICD-10-CM | POA: Diagnosis not present

## 2022-07-21 DIAGNOSIS — E039 Hypothyroidism, unspecified: Secondary | ICD-10-CM

## 2022-07-21 DIAGNOSIS — R2990 Unspecified symptoms and signs involving the nervous system: Secondary | ICD-10-CM | POA: Diagnosis not present

## 2022-07-21 DIAGNOSIS — R519 Headache, unspecified: Secondary | ICD-10-CM | POA: Diagnosis not present

## 2022-07-21 DIAGNOSIS — G459 Transient cerebral ischemic attack, unspecified: Secondary | ICD-10-CM | POA: Diagnosis not present

## 2022-07-21 DIAGNOSIS — I10 Essential (primary) hypertension: Secondary | ICD-10-CM

## 2022-07-21 DIAGNOSIS — Z471 Aftercare following joint replacement surgery: Secondary | ICD-10-CM | POA: Diagnosis not present

## 2022-07-21 DIAGNOSIS — R531 Weakness: Secondary | ICD-10-CM | POA: Diagnosis not present

## 2022-07-21 LAB — ECHOCARDIOGRAM COMPLETE
AR max vel: 0.71 cm2
AV Area VTI: 0.79 cm2
AV Area mean vel: 0.71 cm2
AV Mean grad: 26.2 mmHg
AV Peak grad: 41.7 mmHg
Ao pk vel: 3.23 m/s
Area-P 1/2: 1.96 cm2
Height: 63 in
S' Lateral: 2.8 cm
Weight: 3088 oz

## 2022-07-21 LAB — CBC
HCT: 41.5 % (ref 36.0–46.0)
Hemoglobin: 13.2 g/dL (ref 12.0–15.0)
MCH: 25.4 pg — ABNORMAL LOW (ref 26.0–34.0)
MCHC: 31.8 g/dL (ref 30.0–36.0)
MCV: 80 fL (ref 80.0–100.0)
Platelets: 217 10*3/uL (ref 150–400)
RBC: 5.19 MIL/uL — ABNORMAL HIGH (ref 3.87–5.11)
RDW: 16.2 % — ABNORMAL HIGH (ref 11.5–15.5)
WBC: 9.7 10*3/uL (ref 4.0–10.5)
nRBC: 0 % (ref 0.0–0.2)

## 2022-07-21 LAB — LIPID PANEL
Cholesterol: 160 mg/dL (ref 0–200)
HDL: 67 mg/dL (ref >40)
LDL Cholesterol: 82 mg/dL (ref 0–99)
Total CHOL/HDL Ratio: 2.4 RATIO
Triglycerides: 54 mg/dL (ref <150)
VLDL: 11 mg/dL (ref 0–40)

## 2022-07-21 LAB — CBG MONITORING, ED
Glucose-Capillary: 142 mg/dL — ABNORMAL HIGH (ref 70–99)
Glucose-Capillary: 123 mg/dL — ABNORMAL HIGH (ref 70–99)
Glucose-Capillary: 90 mg/dL (ref 70–99)

## 2022-07-21 LAB — HEMOGLOBIN A1C
Hgb A1c MFr Bld: 6.6 % — ABNORMAL HIGH (ref 4.8–5.6)
Mean Plasma Glucose: 142.72 mg/dL

## 2022-07-21 LAB — BASIC METABOLIC PANEL
Anion gap: 13 (ref 5–15)
BUN: 16 mg/dL (ref 8–23)
CO2: 21 mmol/L — ABNORMAL LOW (ref 22–32)
Calcium: 9.4 mg/dL (ref 8.9–10.3)
Chloride: 96 mmol/L — ABNORMAL LOW (ref 98–111)
Creatinine, Ser: 1.02 mg/dL — ABNORMAL HIGH (ref 0.44–1.00)
GFR, Estimated: 52 mL/min — ABNORMAL LOW (ref >60)
Glucose, Bld: 145 mg/dL — ABNORMAL HIGH (ref 70–99)
Potassium: 3.6 mmol/L (ref 3.5–5.1)
Sodium: 130 mmol/L — ABNORMAL LOW (ref 135–145)

## 2022-07-21 LAB — MAGNESIUM: Magnesium: 2.1 mg/dL (ref 1.7–2.4)

## 2022-07-21 LAB — PHOSPHORUS: Phosphorus: 3.2 mg/dL (ref 2.5–4.6)

## 2022-07-21 MED ORDER — INSULIN ASPART 100 UNIT/ML IJ SOLN: 0.0000 [IU] | Freq: Every day | INTRAMUSCULAR | Status: DC

## 2022-07-21 MED ORDER — CLOPIDOGREL BISULFATE 75 MG PO TABS: 75.0000 mg | ORAL_TABLET | Freq: Every day | ORAL | 0 refills | Status: AC

## 2022-07-21 MED ORDER — POLYETHYLENE GLYCOL 3350 17 G PO PACK: 17.0000 g | PACK | Freq: Every day | ORAL | Status: DC | PRN

## 2022-07-21 MED ORDER — MONTELUKAST SODIUM 10 MG PO TABS
10.0000 mg | ORAL_TABLET | Freq: Every day | ORAL | Status: DC
  Administered 2022-07-21: 10 mg via ORAL
  Filled 2022-07-21: qty 1

## 2022-07-21 MED ORDER — ASPIRIN 81 MG PO TBEC: 81.0000 mg | DELAYED_RELEASE_TABLET | Freq: Every day | ORAL | 12 refills | Status: AC

## 2022-07-21 MED ORDER — LEVOTHYROXINE SODIUM 50 MCG PO TABS
50.0000 ug | ORAL_TABLET | Freq: Every day | ORAL | Status: DC
  Administered 2022-07-21: 50 ug via ORAL
  Filled 2022-07-21: qty 1

## 2022-07-21 MED ORDER — LORATADINE 10 MG PO TABS
10.0000 mg | ORAL_TABLET | Freq: Every day | ORAL | Status: DC
  Administered 2022-07-21: 10 mg via ORAL
  Filled 2022-07-21: qty 1

## 2022-07-21 MED ORDER — MELATONIN 5 MG PO TABS: 5.0000 mg | ORAL_TABLET | Freq: Every evening | ORAL | Status: DC | PRN

## 2022-07-21 MED ORDER — PERFLUTREN LIPID MICROSPHERE
1.0000 mL | INTRAVENOUS | Status: AC | PRN
  Administered 2022-07-21: 2 mL via INTRAVENOUS

## 2022-07-21 MED ORDER — LINACLOTIDE 145 MCG PO CAPS
290.0000 ug | ORAL_CAPSULE | Freq: Every day | ORAL | Status: DC
  Filled 2022-07-21: qty 2

## 2022-07-21 MED ORDER — SODIUM CHLORIDE 0.9 % IV SOLN: INTRAVENOUS | Status: DC

## 2022-07-21 MED ORDER — ATORVASTATIN CALCIUM 40 MG PO TABS
40.0000 mg | ORAL_TABLET | Freq: Every day | ORAL | Status: DC
  Administered 2022-07-21 (×2): 40 mg via ORAL
  Filled 2022-07-21 (×2): qty 1

## 2022-07-21 MED ORDER — ACETAMINOPHEN 325 MG PO TABS: 650.0000 mg | ORAL_TABLET | Freq: Four times a day (QID) | ORAL | Status: DC | PRN

## 2022-07-21 MED ORDER — INSULIN ASPART 100 UNIT/ML IJ SOLN
0.0000 [IU] | Freq: Three times a day (TID) | INTRAMUSCULAR | Status: DC
  Administered 2022-07-21: 1 [IU] via SUBCUTANEOUS

## 2022-07-21 MED ORDER — LABETALOL HCL 5 MG/ML IV SOLN: 5.0000 mg | INTRAVENOUS | Status: DC | PRN

## 2022-07-21 MED ORDER — OMEGA-3-ACID ETHYL ESTERS 1 G PO CAPS
1.0000 g | ORAL_CAPSULE | Freq: Two times a day (BID) | ORAL | Status: DC
  Filled 2022-07-21 (×2): qty 1

## 2022-07-21 MED ORDER — PROCHLORPERAZINE EDISYLATE 10 MG/2ML IJ SOLN: 5.0000 mg | Freq: Four times a day (QID) | INTRAMUSCULAR | Status: DC | PRN

## 2022-07-21 MED ORDER — ASPIRIN 81 MG PO TBEC
81.0000 mg | DELAYED_RELEASE_TABLET | Freq: Every day | ORAL | Status: DC
  Administered 2022-07-21: 81 mg via ORAL
  Filled 2022-07-21 (×2): qty 1

## 2022-07-21 MED ORDER — CLOPIDOGREL BISULFATE 75 MG PO TABS: 75.0000 mg | ORAL_TABLET | Freq: Every day | ORAL | Status: DC

## 2022-07-21 NOTE — ED Notes (Signed)
ED TO INPATIENT HANDOFF REPORT  ED Nurse Name and Phone #: Karina Lenderman, RN [phone]  S Name/Age/Gender Susan Randall 89 y.o. female Room/Bed: 038C/038C  Code Status   Code Status: Full Code  Home/SNF/Other Home Patient oriented to: self, place, time, and situation Is this baseline? Yes   Triage Complete: Triage complete  Chief Complaint Stroke-like symptoms [R29.90]  Triage Note Pt c/o sharp pain in the back of her head x 3 hours. States that she has had similar episodes before but they would last a few minutes. Pt in NAD at time of triage.   Allergies No Known Allergies  Level of Care/Admitting Diagnosis ED Disposition     ED Disposition  Admit   Condition  --   Comment  Hospital Area: [facility] [[id number]]  Level of Care: Telemetry Medical [104]  May place patient in observation at [facility] or [facility] if equivalent level of care is available:: No  Covid Evaluation: Asymptomatic - no recent exposure (last 10 days) testing not required  Diagnosis: Stroke-like symptoms [725552]  Admitting Physician: Darlin Drop [[id number]]  Attending Physician: Darlin Drop [[id number]]          B Medical/Surgery History Past Medical History:  Diagnosis Date   Anxiety    Arthritis    Breast density    right   Cholesterol retinal embolus    Hyperlipidemia    Hypertension    Insomnia 07/07/2007   Lipid disorder    Obesity    Osteopenia    Osteoporosis    Past Surgical History:  Procedure Laterality Date   APPENDECTOMY  1944     A IV Location/Drains/Wounds Patient Lines/Drains/Airways Status     Active Line/Drains/Airways     Name Placement date Placement time Site Days   Peripheral IV 07/20/22 20 G Left Antecubital 07/20/22  2155  Antecubital  1            Intake/Output Last 24 hours  Intake/Output Summary (Last 24 hours) at 07/21/2022 1337 Last data filed at 07/21/2022 1040 Gross per 24 hour  Intake 419.54 ml  Output --  Net  419.54 ml    Labs/Imaging Results for orders placed or performed during the hospital encounter of 07/20/22 (from the past 48 hour(s))  Urine rapid drug screen (hosp performed)     Status: None   Collection Time: 07/20/22  9:47 PM  Result Value Ref Range   Opiates NONE DETECTED NONE DETECTED   Cocaine NONE DETECTED NONE DETECTED   Benzodiazepines NONE DETECTED NONE DETECTED   Amphetamines NONE DETECTED NONE DETECTED   Tetrahydrocannabinol NONE DETECTED NONE DETECTED   Barbiturates NONE DETECTED NONE DETECTED    Comment: (NOTE) DRUG SCREEN FOR MEDICAL PURPOSES ONLY.  IF CONFIRMATION IS NEEDED FOR ANY PURPOSE, NOTIFY LAB WITHIN 5 DAYS.  LOWEST DETECTABLE LIMITS FOR URINE DRUG SCREEN Drug Class                     Cutoff (ng/mL) Amphetamine and metabolites    1000 Barbiturate and metabolites    200 Benzodiazepine                 200 Opiates and metabolites        300 Cocaine and metabolites        300 THC                            50 Performed at [facility]  [facility], [address] [place], [place] [address]   Urinalysis, Routine w reflex microscopic -Urine, Clean Catch     Status: Abnormal   Collection Time: 07/20/22  9:47 PM  Result Value Ref Range   Color, Urine STRAW (A) YELLOW   APPearance CLEAR CLEAR   Specific Gravity, Urine 1.025 1.005 - 1.030   pH 7.0 5.0 - 8.0   Glucose, UA >=500 (A) NEGATIVE mg/dL   Hgb urine dipstick SMALL (A) NEGATIVE   Bilirubin Urine NEGATIVE NEGATIVE   Ketones, ur NEGATIVE NEGATIVE mg/dL   Protein, ur NEGATIVE NEGATIVE mg/dL   Nitrite NEGATIVE NEGATIVE   Leukocytes,Ua NEGATIVE NEGATIVE   RBC / HPF 0-5 0 - 5 RBC/hpf   WBC, UA 0-5 0 - 5 WBC/hpf   Bacteria, UA RARE (A) NONE SEEN   Squamous Epithelial / HPF 0-5 0 - 5 /HPF    Comment: Performed at [facility], [address] [place], [place] [address]  Ethanol     Status: None   Collection Time: 07/20/22  9:57 PM  Result Value Ref Range   Alcohol, Ethyl (B) <10 <10 mg/dL     Comment: (NOTE) Lowest detectable limit for serum alcohol is 10 mg/dL.  For medical purposes only. Performed at [facility], [address], [place], [place] [address]   Protime-INR     Status: None   Collection Time: 07/20/22  9:57 PM  Result Value Ref Range   Prothrombin Time 13.4 11.4 - 15.2 seconds   INR 1.0 0.8 - 1.2    Comment: (NOTE) INR goal varies based on device and disease states. Performed at [facility], [address] [place], [place] [address]   APTT     Status: None   Collection Time: 07/20/22  9:57 PM  Result Value Ref Range   aPTT 27 24 - 36 seconds    Comment: Performed at [facility], [address], [place], [place] [address]  CBC     Status: Abnormal   Collection Time: 07/20/22  9:57 PM  Result Value Ref Range   WBC 10.6 (H) 4.0 - 10.5 K/uL   RBC 5.27 (H) 3.87 - 5.11 MIL/uL   Hemoglobin 13.4 12.0 - 15.0 g/dL   HCT 13.0 86.5 - 78.4 %   MCV 80.3 80.0 - 100.0 fL   MCH 25.4 (L) 26.0 - 34.0 pg   MCHC 31.7 30.0 - 36.0 g/dL   RDW 69.6 (H) 29.5 - 28.4 %   Platelets 223 150 - 400 K/uL   nRBC 0.0 0.0 - 0.2 %    Comment: Performed at [facility], [address] [place], [place] [address]  Differential     Status: None   Collection Time: 07/20/22  9:57 PM  Result Value Ref Range   Neutrophils Relative % 66 %   Neutro Abs 7.0 1.7 - 7.7 K/uL   Lymphocytes Relative 23 %   Lymphs Abs 2.5 0.7 - 4.0 K/uL   Monocytes Relative 8 %   Monocytes Absolute 0.9 0.1 - 1.0 K/uL   Eosinophils Relative 1 %   Eosinophils Absolute 0.2 0.0 - 0.5 K/uL   Basophils Relative 1 %   Basophils Absolute 0.1 0.0 - 0.1 K/uL   Immature Granulocytes 1 %   Abs Immature Granulocytes 0.06 0.00 - 0.07 K/uL    Comment: Performed at [facility] Lab, [address] [place], [place] [address]  Comprehensive metabolic panel  Status: Abnormal   Collection Time: 07/20/22  9:57 PM  Result Value Ref Range   Sodium 132 (L) 135 - 145 mmol/L   Potassium 3.5 3.5 -  5.1 mmol/L   Chloride 95 (L) 98 - 111 mmol/L   CO2 23 22 - 32 mmol/L   Glucose, Bld 135 (H) 70 - 99 mg/dL    Comment: Glucose reference range applies only to samples taken after fasting for at least 8 hours.   BUN 16 8 - 23 mg/dL   Creatinine, Ser 1.69 (H) 0.44 - 1.00 mg/dL   Calcium 9.6 8.9 - 67.8 mg/dL   Total Protein 7.5 6.5 - 8.1 g/dL   Albumin 3.9 3.5 - 5.0 g/dL   AST 17 15 - 41 U/L   ALT 13 0 - 44 U/L   Alkaline Phosphatase 50 38 - 126 U/L   Total Bilirubin 0.3 0.3 - 1.2 mg/dL   GFR, Estimated 47 (L) >60 mL/min    Comment: (NOTE) Calculated using the CKD-EPI Creatinine Equation (2021)    Anion gap 14 5 - 15    Comment: Performed at [facility], [address] [place], [place] [address]  Sedimentation rate     Status: None   Collection Time: 07/20/22  9:57 PM  Result Value Ref Range   Sed Rate 15 0 - 22 mm/hr    Comment: Performed at [facility], [address] [place], [place] [address]  C-reactive protein     Status: None   Collection Time: 07/20/22  9:57 PM  Result Value Ref Range   CRP 0.6 <1.0 mg/dL    Comment: Performed at [facility], [address], [place], [place] [address]  I-stat chem 8, ED     Status: Abnormal   Collection Time: 07/20/22 10:43 PM  Result Value Ref Range   Sodium 132 (L) 135 - 145 mmol/L   Potassium 3.8 3.5 - 5.1 mmol/L   Chloride 95 (L) 98 - 111 mmol/L   BUN 17 8 - 23 mg/dL   Creatinine, Ser 7.78 (H) 0.44 - 1.00 mg/dL   Glucose, Bld 242 (H) 70 - 99 mg/dL    Comment: Glucose reference range applies only to samples taken after fasting for at least 8 hours.   Calcium, Ion 1.19 1.15 - 1.40 mmol/L   TCO2 26 22 - 32 mmol/L   Hemoglobin 15.3 (H) 12.0 - 15.0 g/dL   HCT 35.3 61.4 - 43.1 %  Lipid panel     Status: None   Collection Time: 07/21/22 12:34 AM  Result Value Ref Range   Cholesterol 160 0 - 200 mg/dL   Triglycerides 54 <540 mg/dL   HDL 67 >08 mg/dL   Total CHOL/HDL Ratio 2.4 RATIO   VLDL 11 0 - 40 mg/dL    LDL Cholesterol 82 0 - 99 mg/dL    Comment:        Total Cholesterol/HDL:CHD Risk Coronary Heart Disease Risk Table                     Men   Women  1/2 Average Risk   3.4   3.3  Average Risk       5.0   4.4  2 X Average Risk   9.6   7.1  3 X Average Risk  23.4   11.0        Use the calculated Patient Ratio above and the CHD Risk Table to determine the patient's  CHD Risk.        ATP III CLASSIFICATION (LDL):  <100     mg/dL   Optimal  409-811  mg/dL   Near or Above                    Optimal  130-159  mg/dL   Borderline  914-782  mg/dL   High  >956     mg/dL   Very High Performed at [facility], [address], [place], [place] [address]   Hemoglobin A1c     Status: Abnormal   Collection Time: 07/21/22 12:34 AM  Result Value Ref Range   Hgb A1c MFr Bld 6.6 (H) 4.8 - 5.6 %    Comment: (NOTE) Pre diabetes:          5.7%-6.4%  Diabetes:              >6.4%  Glycemic control for   <7.0% adults with diabetes    Mean Plasma Glucose 142.72 mg/dL    Comment: Performed at [facility], [address], [place], [place] [address]  CBC     Status: Abnormal   Collection Time: 07/21/22 12:34 AM  Result Value Ref Range   WBC 9.7 4.0 - 10.5 K/uL   RBC 5.19 (H) 3.87 - 5.11 MIL/uL   Hemoglobin 13.2 12.0 - 15.0 g/dL   HCT 69.6 29.5 - 28.4 %   MCV 80.0 80.0 - 100.0 fL   MCH 25.4 (L) 26.0 - 34.0 pg   MCHC 31.8 30.0 - 36.0 g/dL   RDW 13.2 (H) 44.0 - 10.2 %   Platelets 217 150 - 400 K/uL   nRBC 0.0 0.0 - 0.2 %    Comment: Performed at [facility], [address], [place], [place] [address]  Basic metabolic panel     Status: Abnormal   Collection Time: 07/21/22 12:34 AM  Result Value Ref Range   Sodium 130 (L) 135 - 145 mmol/L   Potassium 3.6 3.5 - 5.1 mmol/L   Chloride 96 (L) 98 - 111 mmol/L   CO2 21 (L) 22 - 32 mmol/L   Glucose, Bld 145 (H) 70 - 99 mg/dL    Comment: Glucose reference range applies only to samples taken after fasting for at least 8 hours.   BUN  16 8 - 23 mg/dL   Creatinine, Ser 6.44 (H) 0.44 - 1.00 mg/dL   Calcium 9.4 8.9 - 03.4 mg/dL   GFR, Estimated 52 (L) >60 mL/min    Comment: (NOTE) Calculated using the CKD-EPI Creatinine Equation (2021)    Anion gap 13 5 - 15    Comment: Performed at [facility] Lab, [address], [place], [place] [address]  Magnesium     Status: None   Collection Time: 07/21/22 12:34 AM  Result Value Ref Range   Magnesium 2.1 1.7 - 2.4 mg/dL    Comment: Performed at [facility], [address], [place], [place] [address]  Phosphorus     Status: None   Collection Time: 07/21/22 12:34 AM  Result Value Ref Range   Phosphorus 3.2 2.5 - 4.6 mg/dL    Comment: Performed at [facility], [address], [place], [place] [address]  CBG monitoring, ED     Status: Abnormal   Collection Time: 07/21/22  1:17 AM  Result Value Ref Range   Glucose-Capillary 142 (H) 70 - 99 mg/dL    Comment: Glucose reference range  applies only to samples taken after fasting for at least 8 hours.  CBG monitoring, ED     Status: Abnormal   Collection Time: 07/21/22  7:58 AM  Result Value Ref Range   Glucose-Capillary 123 (H) 70 - 99 mg/dL    Comment: Glucose reference range applies only to samples taken after fasting for at least 8 hours.  CBG monitoring, ED     Status: None   Collection Time: 07/21/22 11:24 AM  Result Value Ref Range   Glucose-Capillary 90 70 - 99 mg/dL    Comment: Glucose reference range applies only to samples taken after fasting for at least 8 hours.   ECHOCARDIOGRAM COMPLETE  Result Date: 07/21/2022    ECHOCARDIOGRAM REPORT   Patient Name:   CEAIRA ERNSTER Date of Exam: 07/21/2022 Medical Rec #:  [id number]       Height:       63.0 in Accession #:    [id number]      Weight:       193.0 lb Date of Birth:  1928/11/30      BSA:          1.905 m Patient Age:    89 years        BP:           143/63 mmHg Patient Gender: F               HR:           60 bpm. Exam Location:  Inpatient Procedure: 2D  Echo, Cardiac Doppler, Color Doppler and Intracardiac            Opacification Agent Indications:    Right-sided weakness  History:        Patient has prior history of Echocardiogram examinations, most                 recent 12/25/2021. Carotid Disease; Risk Factors:Dyslipidemia,                 Hypertension and Diabetes.  Sonographer:    Milbert Coulter Referring Phys: [id number] CAROLE N HALL IMPRESSIONS  1. Left ventricular ejection fraction, by estimation, is 65 to 70%. The left ventricle has normal function. The left ventricle has no regional wall motion abnormalities. There is mild concentric left ventricular hypertrophy. Left ventricular diastolic parameters are consistent with Grade I diastolic dysfunction (impaired relaxation).  2. Right ventricular systolic function was not well visualized. The right ventricular size is normal.  3. The mitral valve is normal in structure. No evidence of mitral valve regurgitation. No evidence of mitral stenosis.  4. The aortic valve is tricuspid. There is severe calcifcation of the aortic valve. Aortic valve regurgitation is not visualized. Moderate to severe aortic valve stenosis.  5. The inferior vena cava is normal in size with greater than 50% respiratory variability, suggesting right atrial pressure of 3 mmHg. Conclusion(s)/Recommendation(s): Very limited study due to poor sound wave transmission. The aortic valve is severely calcified with probable severe AS. FINDINGS  Left Ventricle: Left ventricular ejection fraction, by estimation, is 65 to 70%. The left ventricle has normal function. The left ventricle has no regional wall motion abnormalities. Definity contrast agent was given IV to delineate the left ventricular  endocardial borders. The left ventricular internal cavity size was normal in size. There is mild concentric left ventricular hypertrophy. Left ventricular diastolic parameters are consistent with Grade I diastolic dysfunction (impaired relaxation). Right  Ventricle: The right ventricular size is normal. No increase in  right ventricular wall thickness. Right ventricular systolic function was not well visualized. Left Atrium: Left atrial size was normal in size. Right Atrium: Right atrial size was normal in size. Pericardium: There is no evidence of pericardial effusion. Mitral Valve: The mitral valve is normal in structure. No evidence of mitral valve regurgitation. No evidence of mitral valve stenosis. Tricuspid Valve: The tricuspid valve is not well visualized. Tricuspid valve regurgitation is not demonstrated. No evidence of tricuspid stenosis. Aortic Valve: The aortic valve is tricuspid. There is severe calcifcation of the aortic valve. Aortic valve regurgitation is not visualized. Moderate to severe aortic stenosis is present. Aortic valve mean gradient measures 26.2 mmHg. Aortic valve peak gradient measures 41.7 mmHg. Aortic valve area, by VTI measures 0.79 cm. Pulmonic Valve: The pulmonic valve was not well visualized. Pulmonic valve regurgitation is not visualized. No evidence of pulmonic stenosis. Aorta: The aortic root is normal in size and structure. Venous: The inferior vena cava is normal in size with greater than 50% respiratory variability, suggesting right atrial pressure of 3 mmHg. IAS/Shunts: No atrial level shunt detected by color flow Doppler.  LEFT VENTRICLE PLAX 2D LVIDd:         4.00 cm   Diastology LVIDs:         2.80 cm   LV e' medial:    4.58 cm/s LV PW:         1.30 cm   LV E/e' medial:  10.5 LV IVS:        1.30 cm   LV e' lateral:   6.08 cm/s LVOT diam:     1.90 cm   LV E/e' lateral: 7.9 LV SV:         66 LV SV Index:   35 LVOT Area:     2.84 cm  RIGHT VENTRICLE RV S prime:     8.16 cm/s TAPSE (M-mode): 1.4 cm LEFT ATRIUM             Index LA diam:        3.30 cm 1.73 cm/m LA Vol (A2C):   39.0 ml 20.48 ml/m LA Vol (A4C):   58.1 ml 30.51 ml/m LA Biplane Vol: 48.5 ml 25.46 ml/m  AORTIC VALVE AV Area (Vmax):    0.71 cm AV Area (Vmean):    0.71 cm AV Area (VTI):     0.79 cm AV Vmax:           322.80 cm/s AV Vmean:          246.000 cm/s AV VTI:            0.842 m AV Peak Grad:      41.7 mmHg AV Mean Grad:      26.2 mmHg LVOT Vmax:         80.30 cm/s LVOT Vmean:        61.800 cm/s LVOT VTI:          0.234 m LVOT/AV VTI ratio: 0.28  AORTA Ao Root diam: 3.20 cm Ao Asc diam:  3.30 cm MITRAL VALVE               TRICUSPID VALVE MV Area (PHT): 1.96 cm    TR Peak grad:   17.0 mmHg MV E velocity: 48.00 cm/s  TR Vmax:        206.00 cm/s MV A velocity: 87.00 cm/s MV E/A ratio:  0.55        SHUNTS  Systemic VTI:  0.23 m                            Systemic Diam: 1.90 cm Arvilla Meres MD Electronically signed by Arvilla Meres MD Signature Date/Time: 07/21/2022/12:09:10 PM    Final    VAS [place] TEMPORAL ARTERY BILATERAL  Result Date: 07/21/2022  TEMPORAL ARTERY REPORT Patient Name:  MARLA POULIOT  Date of Exam:   07/21/2022 Medical Rec #: [id number]        Accession #:    [id number] Date of Birth: Aug 23, 1928       Patient Gender: F Patient Age:   19 years Exam Location:  [facility] Procedure:      VAS [place] TEMPORTAL ARTERY BILATERAL Referring Phys: Hetty Blend --------------------------------------------------------------------------------  Indications: Headache. High Risk Factors: Age > 50 yrs and female.  Comparison Study: No prior study Performing Technologist: Sherren Kerns RVS  Examination Guidelines: Patient in reclined position. 2D, color and spectral doppler sampling in the temporal artery along the hairline and temple in the longitudinal plane. 2D images along the hairline and temple in the transverse plane. Exam is bilateral.    Preliminary    MR BRAIN WO CONTRAST  Result Date: 07/21/2022 CLINICAL DATA:  Transient ischemic attack, headache, weakness EXAM: MRI HEAD WITHOUT CONTRAST TECHNIQUE: Multiplanar, multiecho pulse sequences of the brain and surrounding structures were obtained without intravenous  contrast. COMPARISON:  No prior MRI head available, correlation is made with 07/20/2022 CT head FINDINGS: Brain: No restricted diffusion to suggest acute or subacute infarct. No acute hemorrhage, mass, mass effect, or midline shift. No hydrocephalus or extra-axial collection. Normal pituitary and craniocervical junction. No hemosiderin deposition to suggest remote hemorrhage. Scattered T2 hyperintense signal in the periventricular white matter and pons, likely the sequela of mild-to-moderate chronic small vessel ischemic disease. Normal cerebral volume for age. Vascular: Normal arterial flow voids. Skull and upper cervical spine: Normal marrow signal. Sinuses/Orbits: Clear paranasal sinuses. No acute finding in the orbits. Status post bilateral lens replacements. Other: The mastoid air cells are well aerated. IMPRESSION: No acute intracranial process. No evidence of acute or subacute infarct. Electronically Signed   By: Wiliam Ke M.D.   On: 07/21/2022 02:21   CT ANGIO HEAD NECK W WO CM (CODE STROKE)  Result Date: 07/20/2022 CLINICAL DATA:  Headache, weakness, stroke suspected EXAM: CT ANGIOGRAPHY HEAD AND NECK WITH AND WITHOUT CONTRAST TECHNIQUE: Multidetector CT imaging of the head and neck was performed using the standard protocol during bolus administration of intravenous contrast. Multiplanar CT image reconstructions and MIPs were obtained to evaluate the vascular anatomy. Carotid stenosis measurements (when applicable) are obtained utilizing NASCET criteria, using the distal internal carotid diameter as the denominator. RADIATION DOSE REDUCTION: This exam was performed according to the departmental dose-optimization program which includes automated exposure control, adjustment of the mA and/or kV according to patient size and/or use of iterative reconstruction technique. CONTRAST:  75mL OMNIPAQUE IOHEXOL 350 MG/ML SOLN COMPARISON:  No prior CTA available, correlation is made with 07/20/2022 CT head  FINDINGS: CT HEAD FINDINGS For noncontrast findings, please see same day CT head. CTA NECK FINDINGS Aortic arch: Standard branching. Imaged portion shows no evidence of aneurysm or dissection. No significant stenosis of the major arch vessel origins. Aortic atherosclerosis. Right carotid system: No evidence of dissection, occlusion, or hemodynamically significant stenosis (greater than 50%). Atherosclerotic disease at the bifurcation and in the proximal ICA is not hemodynamically significant. Beading in the  mid right ICA (series 8, image 153). Left carotid system: No evidence of dissection, occlusion, or hemodynamically significant stenosis (greater than 50%). Beading in the mid left ICA (series 8, image 149). Vertebral arteries: No evidence of dissection, occlusion, or hemodynamically significant stenosis (greater than 50%). Skeleton: No acute osseous abnormality. Degenerative changes in the cervical spine. Other neck: No acute finding. Upper chest: No focal pulmonary opacity or pleural effusion. Review of the MIP images confirms the above findings CTA HEAD FINDINGS Anterior circulation: Both internal carotid arteries are patent to the termini, where calcifications but without significant stenosis. A1 segments patent. Normal anterior communicating artery. Anterior cerebral arteries are patent to their distal aspects without significant stenosis. No M1 stenosis or occlusion. MCA branches perfused to their distal aspects without significant stenosis. Posterior circulation: Vertebral arteries patent to the vertebrobasilar junction without significant stenosis. Posterior inferior cerebellar arteries patent proximally. Basilar patent to its distal aspect without significant stenosis. Superior cerebellar arteries patent proximally. Patent P1 segments. PCAs perfused to their distal aspects without significant stenosis. The bilateral posterior communicating arteries are not visualized. Venous sinuses: As permitted by  contrast timing, patent. Anatomic variants: None significant. Review of the MIP images confirms the above findings IMPRESSION: 1. No intracranial large vessel occlusion or significant stenosis. 2. No hemodynamically significant stenosis in the neck. 3. Beading in the bilateral mid ICAs, which could represent fibromuscular dysplasia. 4. Aortic atherosclerosis. Aortic Atherosclerosis (ICD10-I70.0). Imaging results were communicated on 07/20/2022 at 10:28 pm to provider Dr. Wilford Corner via secure text paging. Electronically Signed   By: Wiliam Ke M.D.   On: 07/20/2022 22:28   CT HEAD CODE STROKE WO CONTRAST  Result Date: 07/20/2022 CLINICAL DATA:  Code stroke.  Weakness EXAM: CT HEAD WITHOUT CONTRAST TECHNIQUE: Contiguous axial images were obtained from the base of the skull through the vertex without intravenous contrast. RADIATION DOSE REDUCTION: This exam was performed according to the departmental dose-optimization program which includes automated exposure control, adjustment of the mA and/or kV according to patient size and/or use of iterative reconstruction technique. COMPARISON:  08/09/2014 CT head FINDINGS: Brain: Possible loss of the right insular ribbon (series 2, image 13) is favored to be artifactual, secondary to beam hardening artifact from the patient's calvarium, given the appearance the sagittal (series 6, image 18). No evidence of acute infarction, hemorrhage, mass, mass effect, or midline shift. No hydrocephalus or extra-axial collection. Vascular: No hyperdense vessel. Atherosclerotic calcifications in the intracranial carotid and vertebral arteries. Skull: Negative for fracture or focal lesion. Sinuses/Orbits: No acute finding. Status post bilateral lens replacements. Other: The mastoid air cells are well aerated. ASPECTS ([place]ber[place] Stroke Program Early CT Score) - Ganglionic level infarction (caudate, lentiform nuclei, internal capsule, insula, M1-M3 cortex): 6 - Supraganglionic infarction (M4-M6  cortex): 3 Total score (0-10 with 10 being normal): 9 IMPRESSION: Possible loss of the right insular ribbon, which is favored to be artifactual. No additional acute intracranial process. ASPECTS is 9. Imaging results were communicated on 07/20/2022 at 10:07 pm to provider Dr. Wilford Corner via secure text paging. Electronically Signed   By: Wiliam Ke M.D.   On: 07/20/2022 22:07    Pending Labs Unresulted Labs (From admission, onward)     Start     Ordered   07/27/22 0500  Creatinine, serum  (enoxaparin (LOVENOX)    CrCl >/= 30 ml/min)  Weekly,   R     Comments: while on enoxaparin therapy    07/20/22 2358  Vitals/Pain Today's Vitals   07/21/22 0941 07/21/22 1000 07/21/22 1126 07/21/22 1335  BP:  (!) 143/80    Pulse:  (!) 59    Resp:  15    Temp:   98 F (36.7 C)   TempSrc:   Oral   SpO2:  100%    Weight:      Height:      PainSc: 0-No pain   0-No pain    Isolation Precautions No active isolations  Medications Medications  enoxaparin (LOVENOX) injection 40 mg (40 mg Subcutaneous Given 07/21/22 0944)  prochlorperazine (COMPAZINE) injection 5 mg (has no administration in time range)  acetaminophen (TYLENOL) tablet 650 mg (has no administration in time range)  polyethylene glycol (MIRALAX / GLYCOLAX) packet 17 g (has no administration in time range)  melatonin tablet 5 mg (has no administration in time range)  aspirin EC tablet 81 mg (81 mg Oral Patient Refused/Not Given 07/21/22 0942)  atorvastatin (LIPITOR) tablet 40 mg (40 mg Oral Given 07/21/22 0942)  levothyroxine (SYNTHROID) tablet 50 mcg (50 mcg Oral Given 07/21/22 0558)  linaclotide (LINZESS) capsule 290 mcg (290 mcg Oral Patient Refused/Not Given 07/21/22 0818)  montelukast (SINGULAIR) tablet 10 mg (10 mg Oral Given 07/21/22 0121)  loratadine (CLARITIN) tablet 10 mg (10 mg Oral Given 07/21/22 0942)  omega-3 acid ethyl esters (LOVAZA) capsule 1 g (has no administration in time range)  0.9 %  sodium chloride infusion  ( Intravenous Infusion Verify 07/21/22 1040)  labetalol (NORMODYNE) injection 5 mg (has no administration in time range)  insulin aspart (novoLOG) injection 0-9 Units ( Subcutaneous Not Given 07/21/22 1127)  insulin aspart (novoLOG) injection 0-5 Units ( Subcutaneous Not Given 07/21/22 0119)  perflutren lipid microspheres (DEFINITY) IV suspension (2 mLs Intravenous Given 07/21/22 1113)  clopidogrel (PLAVIX) tablet 75 mg (has no administration in time range)  metoCLOPramide (REGLAN) injection 10 mg (10 mg Intravenous Given 07/20/22 2225)  diphenhydrAMINE (BENADRYL) injection 25 mg (25 mg Intravenous Given 07/20/22 2227)  iohexol (OMNIPAQUE) 350 MG/ML injection 75 mL (75 mLs Intravenous Contrast Given 07/20/22 2215)    Mobility walks     Focused Assessments    R Recommendations: See Admitting Provider Note  Report given to:   Additional Notes: NIH is a 0, Patient symptoms resolved, she is ambulatory to the restroom independently and feeds herself. Family is at bedside, both are kind and helpful.

## 2022-07-21 NOTE — Evaluation (Signed)
Occupational Therapy Evaluation Patient Details Name: Susan Randall MRN: [id number] DOB: 1928-05-04 Today's Date: 07/21/2022   History of Present Illness Pt is 89 year old presented to [facility] on  7/14 for headache and weakness. CT and MRI negative. PMH - arthritis, anxiety, htn, osteoporosis   Clinical Impression   Pt currently at baseline independent/modified independent level for simulate selfcare tasks, functional transfers, and mobility without use of an assistive device.  She lives at [facility] where they take care of most of her meals.  Feel she does not warrant any further OT needs at this time or equipment.        Recommendations for follow up therapy are one component of a multi-disciplinary discharge planning process, led by the attending physician.  Recommendations may be updated based on patient status, additional functional criteria and insurance authorization.   Assistance Recommended at Discharge None     Functional Status Assessment  Patient has not had a recent decline in their functional status  Equipment Recommendations  None recommended by OT       Precautions / Restrictions Precautions Precautions: None Restrictions Weight Bearing Restrictions: No      Mobility Bed Mobility Overal bed mobility: Modified Independent                  Transfers Overall transfer level: Independent                        Balance Overall balance assessment: No apparent balance deficits (not formally assessed)                                         ADL either performed or assessed with clinical judgement   ADL Overall ADL's : Modified independent                                       General ADL Comments: Pt independent for transfers and functional mobility.  No deficits noted with  BP at 160/76 in sitting with HR stable.     Vision Baseline Vision/History: 1 Wears glasses Ability to See in Adequate Light:  0 Adequate Patient Visual Report: No change from baseline Vision Assessment?: No apparent visual deficits     Perception  WFL   Praxis  [facility]    Pertinent Vitals/Pain Pain Assessment Pain Assessment: No/denies pain     Hand Dominance Right   Extremity/Trunk Assessment Upper Extremity Assessment Upper Extremity Assessment: Overall WFL for tasks assessed (History of right humeral fx with slight limitations in AROM in shoulder flexion 0-120 degrees and slight internal rotation deficits when reaching behind her.)   Lower Extremity Assessment Lower Extremity Assessment: Overall WFL for tasks assessed   Cervical / Trunk Assessment Cervical / Trunk Assessment: Normal   Communication Communication Communication: No difficulties   Cognition Arousal/Alertness: Awake/alert Behavior During Therapy: WFL for tasks assessed/performed Overall Cognitive Status: Within Functional Limits for tasks assessed                                                  Home Living Family/patient expects to be discharged to:: Other (Comment) (independent living at [facility]) Living Arrangements: Alone Available Help at Discharge: Available PRN/intermittently Type of Home: Apartment Home Access: Elevator     Home Layout: One level     Bathroom Shower/Tub: Producer, television/film/video: Handicapped height Bathroom Accessibility: Yes   Home Equipment: None   Additional Comments: Drives occassionally [place] highways      Prior Functioning/Environment Prior Level of Function : Independent/Modified Independent             Mobility Comments: Ambulated without assistance          OT Problem List:        OT Treatment/Interventions:      OT Goals(Current goals can be found in the care plan section)    OT Frequency:      Co-evaluation              AM-PAC OT "6 Clicks" Daily Activity     Outcome Measure Help from another person eating meals?:  None Help from another person taking care of personal grooming?: None Help from another person toileting, which includes using toliet, bedpan, or urinal?: None Help from another person bathing (including washing, rinsing, drying)?: None Help from another person to put on and taking off regular upper body clothing?: None Help from another person to put on and taking off regular lower body clothing?: None 6 Click Score: 24   End of Session Equipment Utilized During Treatment: Gait belt Nurse Communication: Mobility status  Activity Tolerance: Patient tolerated treatment well Patient left: in bed;with call bell/phone within reach                   Time: 1120-1153 OT Time Calculation (min): 33 min Charges:  OT General Charges $OT Visit: 1 Visit OT Evaluation $OT Eval Low Complexity: 1 Low OT Treatments $Self Care/Home Management : 8-22 mins  Perrin Maltese, OTR/L Acute Rehabilitation Services  Office [phone] 07/21/2022

## 2022-07-21 NOTE — Assessment & Plan Note (Signed)
Stable

## 2022-07-21 NOTE — Progress Notes (Addendum)
STROKE TEAM PROGRESS NOTE   BRIEF HPI [name] is a 89 y.o. female with history of hypertension, hyperlipidemia, obesity, prior history of cholesterol retinal embolus with no residual deficits  presenting 7/14 as a code stroke with sudden onset of headache around 2 PM with some blurred vision and a few hours later had some weakness which initially was reported to be right-sided weakness but she reported generalized weakness and no right-sided weakness was seen on exam.   Today, patient described the headache as being mostly in the back of her head that then increased and moved onto the left side of her head, constant with almost sharp quality.   SIGNIFICANT HOSPITAL EVENTS 7/14: Presented as code stroke due to sudden onset of headache, questionable right-sided weakness No TNK due to outside of window. MRI negative, CT/CTA negative  INTERIM HISTORY/SUBJECTIVE Today, patient described the headache that brought her in as being mostly in the back of her head that then increased and moved onto the left side of her head, constant with almost sharp quality. Denies headache today.  Right grip slightly decreased and fine motor movements slightly slower on right which patient believes is due to her trigger finger.    OBJECTIVE  CBC    Component Value Date/Time   WBC 9.7 07/21/2022 0034   RBC 5.19 (H) 07/21/2022 0034   HGB 13.2 07/21/2022 0034   HCT 41.5 07/21/2022 0034   PLT 217 07/21/2022 0034   MCV 80.0 07/21/2022 0034   MCH 25.4 (L) 07/21/2022 0034   MCHC 31.8 07/21/2022 0034   RDW 16.2 (H) 07/21/2022 0034   LYMPHSABS 2.5 07/20/2022 2157   MONOABS 0.9 07/20/2022 2157   EOSABS 0.2 07/20/2022 2157   BASOSABS 0.1 07/20/2022 2157    BMET    Component Value Date/Time   NA 130 (L) 07/21/2022 0034   K 3.6 07/21/2022 0034   CL 96 (L) 07/21/2022 0034   CO2 21 (L) 07/21/2022 0034   GLUCOSE 145 (H) 07/21/2022 0034   BUN 16 07/21/2022 0034   CREATININE 1.02 (H) 07/21/2022 0034    CALCIUM 9.4 07/21/2022 0034   GFRNONAA 52 (L) 07/21/2022 0034    IMAGING past 24 hours ECHOCARDIOGRAM COMPLETE  Result Date: 07/21/2022    ECHOCARDIOGRAM REPORT   Patient Name:   Susan Randall Date of Exam: 07/21/2022 Medical Rec #:  [id number]       Height:       63.0 in Accession #:    [id number]      Weight:       193.0 lb Date of Birth:  19-Dec-1928      BSA:          1.905 m Patient Age:    89 years        BP:           143/63 mmHg Patient Gender: F               HR:           60 bpm. Exam Location:  Inpatient Procedure: 2D Echo, Cardiac Doppler, Color Doppler and Intracardiac            Opacification Agent Indications:    Right-sided weakness  History:        Patient has prior history of Echocardiogram examinations, most                 recent 12/25/2021. Carotid Disease; Risk Factors:Dyslipidemia,  Hypertension and Diabetes.  Sonographer:    Milbert Coulter Referring Phys: [id number] CAROLE N HALL IMPRESSIONS  1. Left ventricular ejection fraction, by estimation, is 65 to 70%. The left ventricle has normal function. The left ventricle has no regional wall motion abnormalities. There is mild concentric left ventricular hypertrophy. Left ventricular diastolic parameters are consistent with Grade I diastolic dysfunction (impaired relaxation).  2. Right ventricular systolic function was not well visualized. The right ventricular size is normal.  3. The mitral valve is normal in structure. No evidence of mitral valve regurgitation. No evidence of mitral stenosis.  4. The aortic valve is tricuspid. There is severe calcifcation of the aortic valve. Aortic valve regurgitation is not visualized. Moderate to severe aortic valve stenosis.  5. The inferior vena cava is normal in size with greater than 50% respiratory variability, suggesting right atrial pressure of 3 mmHg. Conclusion(s)/Recommendation(s): Very limited study due to poor sound wave transmission. The aortic valve is severely calcified  with probable severe AS. FINDINGS  Left Ventricle: Left ventricular ejection fraction, by estimation, is 65 to 70%. The left ventricle has normal function. The left ventricle has no regional wall motion abnormalities. Definity contrast agent was given IV to delineate the left ventricular  endocardial borders. The left ventricular internal cavity size was normal in size. There is mild concentric left ventricular hypertrophy. Left ventricular diastolic parameters are consistent with Grade I diastolic dysfunction (impaired relaxation). Right Ventricle: The right ventricular size is normal. No increase in right ventricular wall thickness. Right ventricular systolic function was not well visualized. Left Atrium: Left atrial size was normal in size. Right Atrium: Right atrial size was normal in size. Pericardium: There is no evidence of pericardial effusion. Mitral Valve: The mitral valve is normal in structure. No evidence of mitral valve regurgitation. No evidence of mitral valve stenosis. Tricuspid Valve: The tricuspid valve is not well visualized. Tricuspid valve regurgitation is not demonstrated. No evidence of tricuspid stenosis. Aortic Valve: The aortic valve is tricuspid. There is severe calcifcation of the aortic valve. Aortic valve regurgitation is not visualized. Moderate to severe aortic stenosis is present. Aortic valve mean gradient measures 26.2 mmHg. Aortic valve peak gradient measures 41.7 mmHg. Aortic valve area, by VTI measures 0.79 cm. Pulmonic Valve: The pulmonic valve was not well visualized. Pulmonic valve regurgitation is not visualized. No evidence of pulmonic stenosis. Aorta: The aortic root is normal in size and structure. Venous: The inferior vena cava is normal in size with greater than 50% respiratory variability, suggesting right atrial pressure of 3 mmHg. IAS/Shunts: No atrial level shunt detected by color flow Doppler.  LEFT VENTRICLE PLAX 2D LVIDd:         4.00 cm   Diastology LVIDs:          2.80 cm   LV e' medial:    4.58 cm/s LV PW:         1.30 cm   LV E/e' medial:  10.5 LV IVS:        1.30 cm   LV e' lateral:   6.08 cm/s LVOT diam:     1.90 cm   LV E/e' lateral: 7.9 LV SV:         66 LV SV Index:   35 LVOT Area:     2.84 cm  RIGHT VENTRICLE RV S prime:     8.16 cm/s TAPSE (M-mode): 1.4 cm LEFT ATRIUM             Index LA diam:  3.30 cm 1.73 cm/m LA Vol (A2C):   39.0 ml 20.48 ml/m LA Vol (A4C):   58.1 ml 30.51 ml/m LA Biplane Vol: 48.5 ml 25.46 ml/m  AORTIC VALVE AV Area (Vmax):    0.71 cm AV Area (Vmean):   0.71 cm AV Area (VTI):     0.79 cm AV Vmax:           322.80 cm/s AV Vmean:          246.000 cm/s AV VTI:            0.842 m AV Peak Grad:      41.7 mmHg AV Mean Grad:      26.2 mmHg LVOT Vmax:         80.30 cm/s LVOT Vmean:        61.800 cm/s LVOT VTI:          0.234 m LVOT/AV VTI ratio: 0.28  AORTA Ao Root diam: 3.20 cm Ao Asc diam:  3.30 cm MITRAL VALVE               TRICUSPID VALVE MV Area (PHT): 1.96 cm    TR Peak grad:   17.0 mmHg MV E velocity: 48.00 cm/s  TR Vmax:        206.00 cm/s MV A velocity: 87.00 cm/s MV E/A ratio:  0.55        SHUNTS                            Systemic VTI:  0.23 m                            Systemic Diam: 1.90 cm Arvilla Meres MD Electronically signed by Arvilla Meres MD Signature Date/Time: 07/21/2022/12:09:10 PM    Final    VAS [place] TEMPORAL ARTERY BILATERAL  Result Date: 07/21/2022  TEMPORAL ARTERY REPORT Patient Name:  KIWANA DEBLASI  Date of Exam:   07/21/2022 Medical Rec #: [id number]        Accession #:    [id number] Date of Birth: March 20, 1928       Patient Gender: F Patient Age:   80 years Exam Location:  [facility] Procedure:      VAS [place] TEMPORTAL ARTERY BILATERAL Referring Phys: Hetty Blend --------------------------------------------------------------------------------  Indications: Headache. High Risk Factors: Age > 50 yrs and female.  Comparison Study: No prior study Performing Technologist: Sherren Kerns RVS   Examination Guidelines: Patient in reclined position. 2D, color and spectral doppler sampling in the temporal artery along the hairline and temple in the longitudinal plane. 2D images along the hairline and temple in the transverse plane. Exam is bilateral.    Preliminary    MR BRAIN WO CONTRAST  Result Date: 07/21/2022 CLINICAL DATA:  Transient ischemic attack, headache, weakness EXAM: MRI HEAD WITHOUT CONTRAST TECHNIQUE: Multiplanar, multiecho pulse sequences of the brain and surrounding structures were obtained without intravenous contrast. COMPARISON:  No prior MRI head available, correlation is made with 07/20/2022 CT head FINDINGS: Brain: No restricted diffusion to suggest acute or subacute infarct. No acute hemorrhage, mass, mass effect, or midline shift. No hydrocephalus or extra-axial collection. Normal pituitary and craniocervical junction. No hemosiderin deposition to suggest remote hemorrhage. Scattered T2 hyperintense signal in the periventricular white matter and pons, likely the sequela of mild-to-moderate chronic small vessel ischemic disease. Normal cerebral volume for age. Vascular: Normal arterial flow voids. Skull and upper cervical  spine: Normal marrow signal. Sinuses/Orbits: Clear paranasal sinuses. No acute finding in the orbits. Status post bilateral lens replacements. Other: The mastoid air cells are well aerated. IMPRESSION: No acute intracranial process. No evidence of acute or subacute infarct. Electronically Signed   By: Wiliam Ke M.D.   On: 07/21/2022 02:21   CT ANGIO HEAD NECK W WO CM (CODE STROKE)  Result Date: 07/20/2022 CLINICAL DATA:  Headache, weakness, stroke suspected EXAM: CT ANGIOGRAPHY HEAD AND NECK WITH AND WITHOUT CONTRAST TECHNIQUE: Multidetector CT imaging of the head and neck was performed using the standard protocol during bolus administration of intravenous contrast. Multiplanar CT image reconstructions and MIPs were obtained to evaluate the vascular  anatomy. Carotid stenosis measurements (when applicable) are obtained utilizing NASCET criteria, using the distal internal carotid diameter as the denominator. RADIATION DOSE REDUCTION: This exam was performed according to the departmental dose-optimization program which includes automated exposure control, adjustment of the mA and/or kV according to patient size and/or use of iterative reconstruction technique. CONTRAST:  75mL OMNIPAQUE IOHEXOL 350 MG/ML SOLN COMPARISON:  No prior CTA available, correlation is made with 07/20/2022 CT head FINDINGS: CT HEAD FINDINGS For noncontrast findings, please see same day CT head. CTA NECK FINDINGS Aortic arch: Standard branching. Imaged portion shows no evidence of aneurysm or dissection. No significant stenosis of the major arch vessel origins. Aortic atherosclerosis. Right carotid system: No evidence of dissection, occlusion, or hemodynamically significant stenosis (greater than 50%). Atherosclerotic disease at the bifurcation and in the proximal ICA is not hemodynamically significant. Beading in the mid right ICA (series 8, image 153). Left carotid system: No evidence of dissection, occlusion, or hemodynamically significant stenosis (greater than 50%). Beading in the mid left ICA (series 8, image 149). Vertebral arteries: No evidence of dissection, occlusion, or hemodynamically significant stenosis (greater than 50%). Skeleton: No acute osseous abnormality. Degenerative changes in the cervical spine. Other neck: No acute finding. Upper chest: No focal pulmonary opacity or pleural effusion. Review of the MIP images confirms the above findings CTA HEAD FINDINGS Anterior circulation: Both internal carotid arteries are patent to the termini, where calcifications but without significant stenosis. A1 segments patent. Normal anterior communicating artery. Anterior cerebral arteries are patent to their distal aspects without significant stenosis. No M1 stenosis or occlusion. MCA  branches perfused to their distal aspects without significant stenosis. Posterior circulation: Vertebral arteries patent to the vertebrobasilar junction without significant stenosis. Posterior inferior cerebellar arteries patent proximally. Basilar patent to its distal aspect without significant stenosis. Superior cerebellar arteries patent proximally. Patent P1 segments. PCAs perfused to their distal aspects without significant stenosis. The bilateral posterior communicating arteries are not visualized. Venous sinuses: As permitted by contrast timing, patent. Anatomic variants: None significant. Review of the MIP images confirms the above findings IMPRESSION: 1. No intracranial large vessel occlusion or significant stenosis. 2. No hemodynamically significant stenosis in the neck. 3. Beading in the bilateral mid ICAs, which could represent fibromuscular dysplasia. 4. Aortic atherosclerosis. Aortic Atherosclerosis (ICD10-I70.0). Imaging results were communicated on 07/20/2022 at 10:28 pm to provider Dr. Wilford Corner via secure text paging. Electronically Signed   By: Wiliam Ke M.D.   On: 07/20/2022 22:28   CT HEAD CODE STROKE WO CONTRAST  Result Date: 07/20/2022 CLINICAL DATA:  Code stroke.  Weakness EXAM: CT HEAD WITHOUT CONTRAST TECHNIQUE: Contiguous axial images were obtained from the base of the skull through the vertex without intravenous contrast. RADIATION DOSE REDUCTION: This exam was performed according to the departmental dose-optimization program which includes automated exposure  control, adjustment of the mA and/or kV according to patient size and/or use of iterative reconstruction technique. COMPARISON:  08/09/2014 CT head FINDINGS: Brain: Possible loss of the right insular ribbon (series 2, image 13) is favored to be artifactual, secondary to beam hardening artifact from the patient's calvarium, given the appearance the sagittal (series 6, image 18). No evidence of acute infarction, hemorrhage, mass,  mass effect, or midline shift. No hydrocephalus or extra-axial collection. Vascular: No hyperdense vessel. Atherosclerotic calcifications in the intracranial carotid and vertebral arteries. Skull: Negative for fracture or focal lesion. Sinuses/Orbits: No acute finding. Status post bilateral lens replacements. Other: The mastoid air cells are well aerated. ASPECTS ([place] Stroke Program Early CT Score) - Ganglionic level infarction (caudate, lentiform nuclei, internal capsule, insula, M1-M3 cortex): 6 - Supraganglionic infarction (M4-M6 cortex): 3 Total score (0-10 with 10 being normal): 9 IMPRESSION: Possible loss of the right insular ribbon, which is favored to be artifactual. No additional acute intracranial process. ASPECTS is 9. Imaging results were communicated on 07/20/2022 at 10:07 pm to provider Dr. Wilford Corner via secure text paging. Electronically Signed   By: Wiliam Ke M.D.   On: 07/20/2022 22:07    Vitals:   07/21/22 0345 07/21/22 0731 07/21/22 1000 07/21/22 1126  BP: 131/74 (!) 143/63 (!) 143/80   Pulse: 62 62 (!) 59   Resp: (!) 22 19 15    Temp:  98 F (36.7 C)  98 F (36.7 C)  TempSrc:    Oral  SpO2: 100% 100% 100%   Weight:      Height:         PHYSICAL EXAM General:  Alert, well-nourished, well-developed pleasant elderly African-American lady patient in no acute distress Psych:  Mood and affect appropriate for situation CV: Regular rate and rhythm on monitor Respiratory:  Regular, unlabored respirations on room air GI: Abdomen soft and nontender   NEURO:  Mental Status: AA&Ox3, patient is able to give clear and coherent history Speech/Language: speech is without dysarthria or aphasia.  Naming, repetition, fluency, and comprehension intact.  Cranial Nerves:  II: PERRL. Visual fields full.  III, IV, VI: EOMI. Eyelids elevate symmetrically.  V: Sensation is intact to light touch and symmetrical to face.  VII: Face is symmetrical resting and smiling VIII: hearing intact to  voice. IX, X: Palate elevates symmetrically. Phonation is normal.  ZO:[id number] shrug 5/5. XII: tongue is midline without fasciculations. Motor: 5/5 strength to all muscle groups tested. Decreased right grip, 4+/5.  Tone: is normal and bulk is normal Sensation- Intact to light touch bilaterally. Extinction absent to light touch to DSS.   Coordination: FTN intact bilaterally: no ataxia in BLE.No drift. Decreased fine motor movements to right hand.  Gait- deferred   ASSESSMENT/PLAN  Left brain TIA versus atypical migraine .  Doubt temporal arteritis Etiology: likely small vessel disease  Code Stroke CT head No acute abnormality. ASPECTS 9.  CTA head & neck: Mo LVO  MRI: Negative  Temporal artery bilateral ultrasound: pending 2D Echo: EF 65 to 70%, grade 1 diastolic dysfunction, severe calcification of the aortic valve, moderate to severe aortic valve stenosis  LDL 82 HgbA1c 6.6 VTE prophylaxis - lovenox No antithrombotic prior to admission, now on aspirin 325 mg daily and clopidogrel 75 mg daily for 3 weeks and then aspirin alone. Therapy recommendations:  No follow up needed Disposition:  pending  Hypertension Heart Murmur Home meds:  Diovan, Diltiazem Stable Blood Pressure Goal: BP less than 220/110, gradually normalize to normotensive in 24-48 hours. Avoid  hypotension.   Diabetes type II Controlled Home meds:  Janumet HgbA1c 6.6, goal < 7.0 CBGs SSI  Hyperlipidemia Home meds:  none LDL 82, goal < 70 Add Lipitor 40mg   Continue statin at discharge  Other Stroke Risk Factors Obesity, Body mass index is 34.19 kg/m., BMI >/= 30 associated with increased stroke risk, recommend weight loss, diet and exercise as appropriate  Cholesterol Retinal Embolus Hypothyroidism Synthroid at home   Hospital day # 0   Pt seen by Neuro NP/APP and later by MD. Note/plan to be edited by MD as needed.    Lynnae January, DNP, AGACNP-BC [facility] Please use AMION for  contact information & EPIC for messaging. STROKE MD NOTE :  I have personally obtained history,examined this patient, reviewed notes, independently viewed imaging studies, participated in medical decision making and plan of care.ROS completed by me personally and pertinent positives fully documented  I have made any additions or clarifications directly to the above note. Agree with note above.  Patient presented with new onset of posterior headache with questionable visual symptoms and transient right-sided weakness which she is currently denying.  Possible UTI versus atypical migraine doubt temporal arteritis given absence of other associated symptoms and normal inflammatory markers.  Check temporal artery ultrasound for halo sign.  Recommend aspirin and Plavix for 3 weeks followed by aspirin alone and aggressive risk factor modification.  Long discussion with patient and daughter at the bedside and answered questions.  Greater than 50% time during this 50-minute visit was spent on counseling and coordination of care about a headache and transient neurological symptoms and discussion about differential diagnosis and evaluation and treatment plan and answering questions.  Delia Heady, MD Medical Director [facility] Pager: [phone] 07/21/2022 1:36 PM   To contact Stroke Continuity provider, please refer to [email or web address]. After hours, contact General Neurology

## 2022-07-21 NOTE — Progress Notes (Signed)
PT Cancellation Note  Patient Details Name: Susan Randall MRN: [id number] DOB: 05-25-1928   Cancelled Treatment:    Reason Eval/Treat Not Completed: PT screened, no needs identified, will sign off. Pt at baseline with mobility per OT.    Angelina Ok [facility] 07/21/2022, 11:57 AM Skip Mayer PT Acute [employer] [phone]

## 2022-07-21 NOTE — ED Notes (Signed)
Patient transported to MRI 

## 2022-07-21 NOTE — Progress Notes (Signed)
VASCULAR LAB    Temporal artery duplex has been performed.  See CV proc for preliminary results.   Arius Harnois, RVT 07/21/2022, 11:01 AM

## 2022-07-21 NOTE — Assessment & Plan Note (Addendum)
Pt seen by neurology. MRI brain negative for Cva. CTA head/neck negative for LVO.  Echo shows normal LVEF 65%. Neurology thought pt had atypical migraine. Dx Temporal arteritis was entertained but pt with normal CRP and ESR making this much less likely. Pt did have temporal artery U/S to look for halo sign. Result is pending but given normal CRP, normal ESR and lack of temporal artery tenderness, pt was felt to be stable for discharge and have her PCP f/u on u/s results.

## 2022-07-21 NOTE — Progress Notes (Signed)
Speech Pathology: Pt passed [place] swallow screen; no SLP swallow eval is warranted per protocol. D/W RN. Will sign off.  Amantha Sklar L. Samson Frederic, MA CCC/SLP Clinical Specialist - Acute Care SLP Acute Rehabilitation Services Office number [phone]

## 2022-07-21 NOTE — Discharge Summary (Signed)
Physician Discharge Summary   Patient name: Susan Randall  Admit date:     07/20/2022  Discharge date: 07/21/2022  Attending Physician: Darlin Drop [[id number]]  Discharge Physician: Carollee Herter   PCP: Renaye Rakers, MD   Recommendations at discharge:  ASA 81 mg daily plus plavix 75 mg daily for 3 weeks, then just ASA 81 mg daily.  Discharge Diagnoses Principal Problem:   Stroke-like symptoms Active Problems:   Acquired hypothyroidism   Hyperlipidemia   Resolved Diagnoses Resolved Problems:   * No resolved hospital problems. [facility] Course   HPI: Susan Randall is a 89 y.o. female with medical history significant for hypertension, hyperlipidemia, DM2, hypothyroidism, who presents with complaints of headache from the back of the head and migrating to the front of the head, worse at the left temporal region.  Also endorses one-sided weakness.  Initially told EDP that she had left-sided weakness and later was noted to have right-sided weakness.  She told the writer that her weakness was improved.  Improvement of her strength was also noted on exam.  Last known well was around 1830.   Code stroke was activated in the ED.  Non-contrast CT head and CT angio head and neck were nonacute.  The patient is not a candidate for thrombolytics or IR intervention.  The patient was seen by neurology/stroke team.  Due to concern for possible giant cell arteritis inflammatory markers were obtained and returned negative, CRP and sed rate were within normal range.     [facility], hospitalist service, was asked to admit for TIA/stroke workup.  Admitted to telemetry medical unit as observation status.  Significant Events:   Significant Labs: CRP normal ESR normal  Significant Imaging Studies: MRI brain negative for CVA CTA head/neck negative for LVO Echo shows normal LVEF  Antibiotic Therapy:   Procedures:   Consultants: neurology     Assessment and Plan: * Stroke-like symptoms Pt  seen by neurology. MRI brain negative for Cva. CTA head/neck negative for LVO.  Echo shows normal LVEF 65%. Neurology thought pt had atypical migraine. Dx Temporal arteritis was entertained but pt with normal CRP and ESR making this much less likely. Pt did have temporal artery U/S to look for halo sign. Result is pending but given normal CRP, normal ESR and lack of temporal artery tenderness, pt was felt to be stable for discharge and have her PCP f/u on u/s results.  Hyperlipidemia Continue zocor.  Acquired hypothyroidism Stable.   Condition at discharge: good  Exam Physical Exam Vitals and nursing note reviewed.  Constitutional:      General: She is not in acute distress.    Appearance: She is not toxic-appearing or diaphoretic.  HENT:     Head: Normocephalic and atraumatic.  Pulmonary:     Effort: Pulmonary effort is normal. No respiratory distress.  Abdominal:     General: Bowel sounds are normal.  Skin:    Capillary Refill: Capillary refill takes less than 2 seconds.  Neurological:     General: No focal deficit present.     Mental Status: She is alert and oriented to person, place, and time.    Disposition: Home  Discharge time: greater than 30 minutes. Allergies as of 07/21/2022   No Known Allergies      Medication List     TAKE these medications    aspirin EC 81 MG tablet Take 1 tablet (81 mg total) by mouth daily. Swallow whole. Start taking on: July 22, 2022   clopidogrel 75 MG tablet Commonly known as: PLAVIX Take 1 tablet (75 mg total) by mouth daily for 21 days.   diltiazem 180 MG 24 hr capsule Commonly known as: TIAZAC Take 180 mg by mouth daily.   fexofenadine 180 MG tablet Commonly known as: ALLEGRA Take 180 mg by mouth daily.   Janumet XR 50-500 MG Tb24 Generic drug: SitaGLIPtin-MetFORMIN HCl Take 1 tablet by mouth in the morning and at bedtime.   levothyroxine 50 MCG tablet Commonly known as: SYNTHROID Take 1 tablet by mouth daily.    Linzess 290 MCG Caps capsule Generic drug: linaclotide Take 290 mcg by mouth daily.   montelukast 10 MG tablet Commonly known as: SINGULAIR Take 1 tablet by mouth at bedtime.   omega-3 acid ethyl esters 1 g capsule Commonly known as: LOVAZA Take 1 g by mouth 2 (two) times daily.   simvastatin 40 MG tablet Commonly known as: ZOCOR Take 40 mg by mouth every evening.   valsartan-hydrochlorothiazide 160-25 MG tablet Commonly known as: DIOVAN-HCT Take 1 tablet by mouth daily.   Vitamin D3 1.25 MG (50000 UT) Caps Take 1 capsule by mouth once a week.        VAS [place] TEMPORAL ARTERY BILATERAL  Result Date: 07/21/2022  TEMPORAL ARTERY REPORT Patient Name:  Susan Randall  Date of Exam:   07/21/2022 Medical Rec #: [id number]        Accession #:    [id number] Date of Birth: 1928-01-10       Patient Gender: F Patient Age:   68 years Exam Location:  [facility] Procedure:      VAS [place] TEMPORAL ARTERY BILATERAL Referring Phys: Hetty Blend --------------------------------------------------------------------------------  Indications: Headache. High Risk Factors: Age > 50 yrs and female.  Comparison Study: No prior study Performing Technologist: Sherren Kerns RVS  Examination Guidelines: Patient in reclined position. 2D, color and spectral doppler sampling in the temporal artery along the hairline and temple in the longitudinal plane. 2D images along the hairline and temple in the transverse plane. Exam is bilateral.    Preliminary    ECHOCARDIOGRAM COMPLETE  Result Date: 07/21/2022    ECHOCARDIOGRAM REPORT   Patient Name:   Susan Randall Date of Exam: 07/21/2022 Medical Rec #:  [id number]       Height:       63.0 in Accession #:    [id number]      Weight:       193.0 lb Date of Birth:  30-Sep-1928      BSA:          1.905 m Patient Age:    89 years        BP:           143/63 mmHg Patient Gender: F               HR:           60 bpm. Exam Location:  Inpatient Procedure: 2D Echo, Cardiac  Doppler, Color Doppler and Intracardiac            Opacification Agent Indications:    Right-sided weakness  History:        Patient has prior history of Echocardiogram examinations, most                 recent 12/25/2021. Carotid Disease; Risk Factors:Dyslipidemia,                 Hypertension and Diabetes.  Sonographer:  Milbert Coulter Referring Phys: [id number] CAROLE N HALL IMPRESSIONS  1. Left ventricular ejection fraction, by estimation, is 65 to 70%. The left ventricle has normal function. The left ventricle has no regional wall motion abnormalities. There is mild concentric left ventricular hypertrophy. Left ventricular diastolic parameters are consistent with Grade I diastolic dysfunction (impaired relaxation).  2. Right ventricular systolic function was not well visualized. The right ventricular size is normal.  3. The mitral valve is normal in structure. No evidence of mitral valve regurgitation. No evidence of mitral stenosis.  4. The aortic valve is tricuspid. There is severe calcifcation of the aortic valve. Aortic valve regurgitation is not visualized. Moderate to severe aortic valve stenosis.  5. The inferior vena cava is normal in size with greater than 50% respiratory variability, suggesting right atrial pressure of 3 mmHg. Conclusion(s)/Recommendation(s): Very limited study due to poor sound wave transmission. The aortic valve is severely calcified with probable severe AS. FINDINGS  Left Ventricle: Left ventricular ejection fraction, by estimation, is 65 to 70%. The left ventricle has normal function. The left ventricle has no regional wall motion abnormalities. Definity contrast agent was given IV to delineate the left ventricular  endocardial borders. The left ventricular internal cavity size was normal in size. There is mild concentric left ventricular hypertrophy. Left ventricular diastolic parameters are consistent with Grade I diastolic dysfunction (impaired relaxation). Right Ventricle: The  right ventricular size is normal. No increase in right ventricular wall thickness. Right ventricular systolic function was not well visualized. Left Atrium: Left atrial size was normal in size. Right Atrium: Right atrial size was normal in size. Pericardium: There is no evidence of pericardial effusion. Mitral Valve: The mitral valve is normal in structure. No evidence of mitral valve regurgitation. No evidence of mitral valve stenosis. Tricuspid Valve: The tricuspid valve is not well visualized. Tricuspid valve regurgitation is not demonstrated. No evidence of tricuspid stenosis. Aortic Valve: The aortic valve is tricuspid. There is severe calcifcation of the aortic valve. Aortic valve regurgitation is not visualized. Moderate to severe aortic stenosis is present. Aortic valve mean gradient measures 26.2 mmHg. Aortic valve peak gradient measures 41.7 mmHg. Aortic valve area, by VTI measures 0.79 cm. Pulmonic Valve: The pulmonic valve was not well visualized. Pulmonic valve regurgitation is not visualized. No evidence of pulmonic stenosis. Aorta: The aortic root is normal in size and structure. Venous: The inferior vena cava is normal in size with greater than 50% respiratory variability, suggesting right atrial pressure of 3 mmHg. IAS/Shunts: No atrial level shunt detected by color flow Doppler.  LEFT VENTRICLE PLAX 2D LVIDd:         4.00 cm   Diastology LVIDs:         2.80 cm   LV e' medial:    4.58 cm/s LV PW:         1.30 cm   LV E/e' medial:  10.5 LV IVS:        1.30 cm   LV e' lateral:   6.08 cm/s LVOT diam:     1.90 cm   LV E/e' lateral: 7.9 LV SV:         66 LV SV Index:   35 LVOT Area:     2.84 cm  RIGHT VENTRICLE RV S prime:     8.16 cm/s TAPSE (M-mode): 1.4 cm LEFT ATRIUM             Index LA diam:        3.30 cm 1.73 cm/m  LA Vol (A2C):   39.0 ml 20.48 ml/m LA Vol (A4C):   58.1 ml 30.51 ml/m LA Biplane Vol: 48.5 ml 25.46 ml/m  AORTIC VALVE AV Area (Vmax):    0.71 cm AV Area (Vmean):   0.71 cm AV  Area (VTI):     0.79 cm AV Vmax:           322.80 cm/s AV Vmean:          246.000 cm/s AV VTI:            0.842 m AV Peak Grad:      41.7 mmHg AV Mean Grad:      26.2 mmHg LVOT Vmax:         80.30 cm/s LVOT Vmean:        61.800 cm/s LVOT VTI:          0.234 m LVOT/AV VTI ratio: 0.28  AORTA Ao Root diam: 3.20 cm Ao Asc diam:  3.30 cm MITRAL VALVE               TRICUSPID VALVE MV Area (PHT): 1.96 cm    TR Peak grad:   17.0 mmHg MV E velocity: 48.00 cm/s  TR Vmax:        206.00 cm/s MV A velocity: 87.00 cm/s MV E/A ratio:  0.55        SHUNTS                            Systemic VTI:  0.23 m                            Systemic Diam: 1.90 cm Arvilla Meres MD Electronically signed by Arvilla Meres MD Signature Date/Time: 07/21/2022/12:09:10 PM    Final    MR BRAIN WO CONTRAST  Result Date: 07/21/2022 CLINICAL DATA:  Transient ischemic attack, headache, weakness EXAM: MRI HEAD WITHOUT CONTRAST TECHNIQUE: Multiplanar, multiecho pulse sequences of the brain and surrounding structures were obtained without intravenous contrast. COMPARISON:  No prior MRI head available, correlation is made with 07/20/2022 CT head FINDINGS: Brain: No restricted diffusion to suggest acute or subacute infarct. No acute hemorrhage, mass, mass effect, or midline shift. No hydrocephalus or extra-axial collection. Normal pituitary and craniocervical junction. No hemosiderin deposition to suggest remote hemorrhage. Scattered T2 hyperintense signal in the periventricular white matter and pons, likely the sequela of mild-to-moderate chronic small vessel ischemic disease. Normal cerebral volume for age. Vascular: Normal arterial flow voids. Skull and upper cervical spine: Normal marrow signal. Sinuses/Orbits: Clear paranasal sinuses. No acute finding in the orbits. Status post bilateral lens replacements. Other: The mastoid air cells are well aerated. IMPRESSION: No acute intracranial process. No evidence of acute or subacute infarct.  Electronically Signed   By: Wiliam Ke M.D.   On: 07/21/2022 02:21   CT ANGIO HEAD NECK W WO CM (CODE STROKE)  Result Date: 07/20/2022 CLINICAL DATA:  Headache, weakness, stroke suspected EXAM: CT ANGIOGRAPHY HEAD AND NECK WITH AND WITHOUT CONTRAST TECHNIQUE: Multidetector CT imaging of the head and neck was performed using the standard protocol during bolus administration of intravenous contrast. Multiplanar CT image reconstructions and MIPs were obtained to evaluate the vascular anatomy. Carotid stenosis measurements (when applicable) are obtained utilizing NASCET criteria, using the distal internal carotid diameter as the denominator. RADIATION DOSE REDUCTION: This exam was performed according to the departmental dose-optimization program which includes automated exposure control, adjustment  of the mA and/or kV according to patient size and/or use of iterative reconstruction technique. CONTRAST:  75mL OMNIPAQUE IOHEXOL 350 MG/ML SOLN COMPARISON:  No prior CTA available, correlation is made with 07/20/2022 CT head FINDINGS: CT HEAD FINDINGS For noncontrast findings, please see same day CT head. CTA NECK FINDINGS Aortic arch: Standard branching. Imaged portion shows no evidence of aneurysm or dissection. No significant stenosis of the major arch vessel origins. Aortic atherosclerosis. Right carotid system: No evidence of dissection, occlusion, or hemodynamically significant stenosis (greater than 50%). Atherosclerotic disease at the bifurcation and in the proximal ICA is not hemodynamically significant. Beading in the mid right ICA (series 8, image 153). Left carotid system: No evidence of dissection, occlusion, or hemodynamically significant stenosis (greater than 50%). Beading in the mid left ICA (series 8, image 149). Vertebral arteries: No evidence of dissection, occlusion, or hemodynamically significant stenosis (greater than 50%). Skeleton: No acute osseous abnormality. Degenerative changes in the  cervical spine. Other neck: No acute finding. Upper chest: No focal pulmonary opacity or pleural effusion. Review of the MIP images confirms the above findings CTA HEAD FINDINGS Anterior circulation: Both internal carotid arteries are patent to the termini, where calcifications but without significant stenosis. A1 segments patent. Normal anterior communicating artery. Anterior cerebral arteries are patent to their distal aspects without significant stenosis. No M1 stenosis or occlusion. MCA branches perfused to their distal aspects without significant stenosis. Posterior circulation: Vertebral arteries patent to the vertebrobasilar junction without significant stenosis. Posterior inferior cerebellar arteries patent proximally. Basilar patent to its distal aspect without significant stenosis. Superior cerebellar arteries patent proximally. Patent P1 segments. PCAs perfused to their distal aspects without significant stenosis. The bilateral posterior communicating arteries are not visualized. Venous sinuses: As permitted by contrast timing, patent. Anatomic variants: None significant. Review of the MIP images confirms the above findings IMPRESSION: 1. No intracranial large vessel occlusion or significant stenosis. 2. No hemodynamically significant stenosis in the neck. 3. Beading in the bilateral mid ICAs, which could represent fibromuscular dysplasia. 4. Aortic atherosclerosis. Aortic Atherosclerosis (ICD10-I70.0). Imaging results were communicated on 07/20/2022 at 10:28 pm to provider Dr. Wilford Corner via secure text paging. Electronically Signed   By: Wiliam Ke M.D.   On: 07/20/2022 22:28   CT HEAD CODE STROKE WO CONTRAST  Result Date: 07/20/2022 CLINICAL DATA:  Code stroke.  Weakness EXAM: CT HEAD WITHOUT CONTRAST TECHNIQUE: Contiguous axial images were obtained from the base of the skull through the vertex without intravenous contrast. RADIATION DOSE REDUCTION: This exam was performed according to the  departmental dose-optimization program which includes automated exposure control, adjustment of the mA and/or kV according to patient size and/or use of iterative reconstruction technique. COMPARISON:  08/09/2014 CT head FINDINGS: Brain: Possible loss of the right insular ribbon (series 2, image 13) is favored to be artifactual, secondary to beam hardening artifact from the patient's calvarium, given the appearance the sagittal (series 6, image 18). No evidence of acute infarction, hemorrhage, mass, mass effect, or midline shift. No hydrocephalus or extra-axial collection. Vascular: No hyperdense vessel. Atherosclerotic calcifications in the intracranial carotid and vertebral arteries. Skull: Negative for fracture or focal lesion. Sinuses/Orbits: No acute finding. Status post bilateral lens replacements. Other: The mastoid air cells are well aerated. ASPECTS ([place] Stroke Program Early CT Score) - Ganglionic level infarction (caudate, lentiform nuclei, internal capsule, insula, M1-M3 cortex): 6 - Supraganglionic infarction (M4-M6 cortex): 3 Total score (0-10 with 10 being normal): 9 IMPRESSION: Possible loss of the right insular ribbon, which is favored  to be artifactual. No additional acute intracranial process. ASPECTS is 9. Imaging results were communicated on 07/20/2022 at 10:07 pm to provider Dr. Wilford Corner via secure text paging. Electronically Signed   By: Wiliam Ke M.D.   On: 07/20/2022 22:07   Results for orders placed or performed during the hospital encounter of 10/10/10  Surgical pcr screen     Status: None   Collection Time: 10/10/10 12:08 PM  Result Value Ref Range Status   MRSA, PCR NEGATIVE NEGATIVE Final   Staphylococcus aureus NEGATIVE NEGATIVE Final    Comment:        The Xpert SA Assay (FDA approved for NASAL specimens only), is one component of a comprehensive surveillance program.  It is not intended to diagnose infection nor to guide or monitor treatment.  Eye culture      Status: None   Collection Time: 10/10/10  3:15 PM   Specimen: Eye  Result Value Ref Range Status   Specimen Description EYE RIGHT  Final   Special Requests NONE  Final   Culture NO GROWTH 2 DAYS  Final   Report Status 10/13/2010 FINAL  Final  Culture, fungus without smear     Status: None   Collection Time: 10/10/10  3:15 PM   Specimen: Eye  Result Value Ref Range Status   Specimen Description EYE RIGHT  Final   Special Requests NONE  Final   Culture No Fungi Isolated in 4 Weeks  Final   Report Status 11/07/2010 FINAL  Final    Signed:  Carollee Herter DO.  [facility] 07/21/2022, 2:28 PM

## 2022-07-21 NOTE — Assessment & Plan Note (Signed)
Continue zocor 

## 2022-07-21 NOTE — Hospital Course (Signed)
HPI: Susan Randall is a 89 y.o. female with medical history significant for hypertension, hyperlipidemia, DM2, hypothyroidism, who presents with complaints of headache from the back of the head and migrating to the front of the head, worse at the left temporal region.  Also endorses one-sided weakness.  Initially told EDP that she had left-sided weakness and later was noted to have right-sided weakness.  She told the writer that her weakness was improved.  Improvement of her strength was also noted on exam.  Last known well was around 1830.   Code stroke was activated in the ED.  Non-contrast CT head and CT angio head and neck were nonacute.  The patient is not a candidate for thrombolytics or IR intervention.  The patient was seen by neurology/stroke team.  Due to concern for possible giant cell arteritis inflammatory markers were obtained and returned negative, CRP and sed rate were within normal range.     TRH, hospitalist service, was asked to admit for TIA/stroke workup.  Admitted to telemetry medical unit as observation status.  Significant Events:   Significant Labs: CRP normal ESR normal  Significant Imaging Studies: MRI brain negative for CVA CTA head/neck negative for LVO Echo shows normal LVEF  Antibiotic Therapy:   Procedures:   Consultants: neurology

## 2022-07-25 DIAGNOSIS — R011 Cardiac murmur, unspecified: Secondary | ICD-10-CM | POA: Diagnosis not present

## 2022-07-25 DIAGNOSIS — Z6834 Body mass index (BMI) 34.0-34.9, adult: Secondary | ICD-10-CM | POA: Diagnosis not present

## 2022-07-25 DIAGNOSIS — E038 Other specified hypothyroidism: Secondary | ICD-10-CM | POA: Diagnosis not present

## 2022-07-25 DIAGNOSIS — E1169 Type 2 diabetes mellitus with other specified complication: Secondary | ICD-10-CM | POA: Diagnosis not present

## 2022-07-25 DIAGNOSIS — E785 Hyperlipidemia, unspecified: Secondary | ICD-10-CM | POA: Diagnosis not present

## 2022-07-25 DIAGNOSIS — E860 Dehydration: Secondary | ICD-10-CM | POA: Diagnosis not present

## 2022-07-25 DIAGNOSIS — E7800 Pure hypercholesterolemia, unspecified: Secondary | ICD-10-CM | POA: Diagnosis not present

## 2022-07-25 DIAGNOSIS — I10 Essential (primary) hypertension: Secondary | ICD-10-CM | POA: Diagnosis not present

## 2022-07-25 DIAGNOSIS — R799 Abnormal finding of blood chemistry, unspecified: Secondary | ICD-10-CM | POA: Diagnosis not present

## 2022-07-27 ENCOUNTER — Encounter: Payer: Self-pay | Admitting: Cardiovascular Disease

## 2022-07-27 NOTE — Progress Notes (Unsigned)
Cardiology Office Note:    Date:  07/29/2022   ID:  Susan Randall, DOB 1928-11-13, MRN [id number]  PCP:  Renaye Rakers, MD   [facility] Providers Cardiologist:  Damiano Stamper   }    Referring MD: Renaye Rakers, MD   Chief Complaint  Patient presents with   Heart Murmur     History of Present Illness:  07/10/21   Susan Randall is a 89 y.o. female with a hx of HTN,HLD ,   We were asked to see her today for evaluation of leg edema by Dr. Parke Simmers .   Very rare swelling of her feet.  No cp or dyspnea Some DOE if she waks too far.  Does exercise at an exercise class [facility])  6 days a week   Her lipids were reviewed and look great .   Has been on Simvastatin   Has had [facility] screening and she thinks she has some plaque build up somewhere    July 28, 2022 Susan Randall is seen for follow up of a murmur Has DOE if she overdoes it  She was admitted with stroke like symptoms on July 20, 2022 Work up for stroke was negative  Was seen by Milon Dikes, MD from neurology   Echo from July 21, 2022:  normal / supranormal LV function with EF 65-70% Grade I DD  Moderate - severe AS .  Mean AV gradient was 26 mmhg.    Is feeling better now       Past Medical History:  Diagnosis Date   Anxiety    Arthritis    Breast density    right   Cholesterol retinal embolus    Hyperlipidemia    Hypertension    Insomnia 07/07/2007   Lipid disorder    Obesity    Osteopenia    Osteoporosis     Past Surgical History:  Procedure Laterality Date   APPENDECTOMY  1944    Current Medications: Current Meds  Medication Sig   aspirin EC 81 MG tablet Take 1 tablet (81 mg total) by mouth daily. Swallow whole.   Cholecalciferol (VITAMIN D3) 1.25 MG (50000 UT) CAPS Take 1 capsule by mouth once a week.   clopidogrel (PLAVIX) 75 MG tablet Take 1 tablet (75 mg total) by mouth daily for 21 days.   diltiazem (TIAZAC) 180 MG 24 hr capsule Take 180 mg by mouth daily.   fexofenadine  (ALLEGRA) 180 MG tablet Take 180 mg by mouth daily.   levothyroxine (SYNTHROID) 50 MCG tablet Take 1 tablet by mouth daily.   LINZESS 290 MCG CAPS capsule Take 290 mcg by mouth daily.   montelukast (SINGULAIR) 10 MG tablet Take 1 tablet by mouth at bedtime.   omega-3 acid ethyl esters (LOVAZA) 1 g capsule Take 1 g by mouth 2 (two) times daily.   simvastatin (ZOCOR) 40 MG tablet Take 40 mg by mouth every evening.   SitaGLIPtin-MetFORMIN HCl (JANUMET XR) 50-500 MG TB24 Take 1 tablet by mouth in the morning and at bedtime.   valsartan-hydrochlorothiazide (DIOVAN-HCT) 160-25 MG tablet Take 1 tablet by mouth daily.     Allergies:   Patient has no known allergies.   Social History   Socioeconomic History   Marital status: Married    Spouse name: Not on file   Number of children: Not on file   Years of education: Not on file   Highest education level: Not on file  Occupational History   Not on file  Tobacco Use   Smoking status: Never   Smokeless tobacco: Never  Vaping Use   Vaping status: Never Used  Substance and Sexual Activity   Alcohol use: Yes    Alcohol/week: 3.0 - 4.0 standard drinks of alcohol    Types: 3 - 4 Shots of liquor per week   Drug use: No   Sexual activity: Not on file  Other Topics Concern   Not on file  Social History Narrative   Not on file   Social Determinants of Health   Financial Resource Strain: Not on file  Food Insecurity: Not on file  Transportation Needs: Not on file  Physical Activity: Not on file  Stress: Not on file  Social Connections: Unknown (05/20/2021)   Received from [facility]   Social Network    Social Network: Not on file     Family History: The patient's family history includes Diabetes in her mother; Heart disease in her mother; Hypertension in her mother.  ROS:   Please see the history of present illness.     All other systems reviewed and are negative.  EKGs/Labs/Other Studies Reviewed:    The  following studies were reviewed today:   EKG:     Recent Labs: 07/20/2022: ALT 13 07/21/2022: BUN 16; Creatinine, Ser 1.02; Hemoglobin 13.2; Magnesium 2.1; Platelets 217; Potassium 3.6; Sodium 130  Recent Lipid Panel    Component Value Date/Time   CHOL 160 07/21/2022 0034   TRIG 54 07/21/2022 0034   HDL 67 07/21/2022 0034   CHOLHDL 2.4 07/21/2022 0034   VLDL 11 07/21/2022 0034   LDLCALC 82 07/21/2022 0034     Risk Assessment/Calculations:           Physical Exam:    Physical Exam: Blood pressure 110/76, pulse (!) 56, height 5\' 3"  (1.6 m), weight 192 lb 9.6 oz (87.4 kg), SpO2 99%.       GEN:  Well nourished, well developed in no acute distress HEENT: Normal NECK: No JVD; No carotid bruits LYMPHATICS: No lymphadenopathy CARDIAC: RRR , 2-3 / 6 harsh systolic murmur at LSB , radiating to RSB  RESPIRATORY:  Clear to auscultation without rales, wheezing or rhonchi  ABDOMEN: Soft, non-tender, non-distended MUSCULOSKELETAL:  No edema; No deformity  SKIN: Warm and dry NEUROLOGIC:  Alert and oriented x 3    ASSESSMENT:    1. Nonrheumatic aortic valve stenosis     PLAN:       Aortic stenosis; [name] has moderate to severe aortic stenosis.  We diagnosed this last year.  She seems to be doing well.  As her aortic stenosis worsens, will refer her to the TAVR team in valve clinic.  At present she is asymptomatic.  Will have her follow-up with our APP in 6 months.        Medication Adjustments/Labs and Tests Ordered: Current medicines are reviewed at length with the patient today.  Concerns regarding medicines are outlined above.  No orders of the defined types were placed in this encounter.  No orders of the defined types were placed in this encounter.   Patient Instructions  Medication Instructions:  Your physician recommends that you continue on your current medications as directed. Please refer to the Current Medication list given to you today.  *If you need  a refill on your cardiac medications before your next appointment, please call your pharmacy*   Lab Work: NONE If you have labs (blood work) drawn today and your tests are completely normal, you  will receive your results only by: MyChart Message (if you have MyChart) OR A paper copy in the mail If you have any lab test that is abnormal or we need to change your treatment, we will call you to review the results.   Testing/Procedures: NONE   Follow-Up: At [facility], you and your health needs are our priority.  As part of our continuing mission to provide you with exceptional heart care, we have created designated Provider Care Teams.  These Care Teams include your primary Cardiologist (physician) and Advanced Practice Providers (APPs -  Physician Assistants and Nurse Practitioners) who all work together to provide you with the care you need, when you need it.  We recommend signing up for the patient portal called "MyChart".  Sign up information is provided on this After Visit Summary.  MyChart is used to connect with patients for Virtual Visits (Telemedicine).  Patients are able to view lab/test results, encounter notes, upcoming appointments, etc.  Non-urgent messages can be sent to your provider as well.   To learn more about what you can do with MyChart, go to [email or web address].    Your next appointment:   6 month(s)  Provider:   Edwin Dada, or Dunn        Signed, Kristeen Miss, MD  07/29/2022 9:51 AM    [facility]

## 2022-07-29 ENCOUNTER — Ambulatory Visit: Payer: Medicare Other | Attending: Cardiovascular Disease | Admitting: Cardiovascular Disease

## 2022-07-29 ENCOUNTER — Encounter: Payer: Self-pay | Admitting: Cardiovascular Disease

## 2022-07-29 VITALS — BP 110/76 | HR 56 | Ht 63.0 in | Wt 192.6 lb

## 2022-07-29 DIAGNOSIS — I350 Nonrheumatic aortic (valve) stenosis: Secondary | ICD-10-CM | POA: Insufficient documentation

## 2022-07-29 NOTE — Patient Instructions (Signed)
Medication Instructions:  Your physician recommends that you continue on your current medications as directed. Please refer to the Current Medication list given to you today.  *If you need a refill on your cardiac medications before your next appointment, please call your pharmacy*   Lab Work: NONE If you have labs (blood work) drawn today and your tests are completely normal, you will receive your results only by: MyChart Message (if you have MyChart) OR A paper copy in the mail If you have any lab test that is abnormal or we need to change your treatment, we will call you to review the results.   Testing/Procedures: NONE   Follow-Up: At [facility], you and your health needs are our priority.  As part of our continuing mission to provide you with exceptional heart care, we have created designated Provider Care Teams.  These Care Teams include your primary Cardiologist (physician) and Advanced Practice Providers (APPs -  Physician Assistants and Nurse Practitioners) who all work together to provide you with the care you need, when you need it.  We recommend signing up for the patient portal called "MyChart".  Sign up information is provided on this After Visit Summary.  MyChart is used to connect with patients for Virtual Visits (Telemedicine).  Patients are able to view lab/test results, encounter notes, upcoming appointments, etc.  Non-urgent messages can be sent to your provider as well.   To learn more about what you can do with MyChart, go to [email or web address].    Your next appointment:   6 month(s)  Provider:   Edwin Dada, or Dunn

## 2022-08-11 DIAGNOSIS — E1169 Type 2 diabetes mellitus with other specified complication: Secondary | ICD-10-CM | POA: Diagnosis not present

## 2022-08-11 DIAGNOSIS — Z634 Disappearance and death of family member: Secondary | ICD-10-CM | POA: Diagnosis not present

## 2022-08-11 DIAGNOSIS — I10 Essential (primary) hypertension: Secondary | ICD-10-CM | POA: Diagnosis not present

## 2022-09-09 DIAGNOSIS — M79644 Pain in right finger(s): Secondary | ICD-10-CM | POA: Diagnosis not present

## 2022-09-15 DIAGNOSIS — Z23 Encounter for immunization: Secondary | ICD-10-CM | POA: Diagnosis not present

## 2022-09-16 DIAGNOSIS — E1169 Type 2 diabetes mellitus with other specified complication: Secondary | ICD-10-CM | POA: Diagnosis not present

## 2022-09-16 DIAGNOSIS — I10 Essential (primary) hypertension: Secondary | ICD-10-CM | POA: Diagnosis not present

## 2022-09-16 DIAGNOSIS — E6609 Other obesity due to excess calories: Secondary | ICD-10-CM | POA: Diagnosis not present

## 2022-09-16 DIAGNOSIS — I739 Peripheral vascular disease, unspecified: Secondary | ICD-10-CM | POA: Diagnosis not present

## 2022-09-18 DIAGNOSIS — U071 COVID-19: Secondary | ICD-10-CM | POA: Diagnosis not present

## 2022-09-18 DIAGNOSIS — Z20822 Contact with and (suspected) exposure to covid-19: Secondary | ICD-10-CM | POA: Diagnosis not present

## 2022-09-18 DIAGNOSIS — R051 Acute cough: Secondary | ICD-10-CM | POA: Diagnosis not present

## 2022-09-18 DIAGNOSIS — Z6833 Body mass index (BMI) 33.0-33.9, adult: Secondary | ICD-10-CM | POA: Diagnosis not present

## 2022-09-24 DIAGNOSIS — M62512 Muscle wasting and atrophy, not elsewhere classified, left shoulder: Secondary | ICD-10-CM | POA: Diagnosis not present

## 2022-09-24 DIAGNOSIS — R2681 Unsteadiness on feet: Secondary | ICD-10-CM | POA: Diagnosis not present

## 2022-09-24 DIAGNOSIS — G8929 Other chronic pain: Secondary | ICD-10-CM | POA: Diagnosis not present

## 2022-09-26 DIAGNOSIS — R2681 Unsteadiness on feet: Secondary | ICD-10-CM | POA: Diagnosis not present

## 2022-09-26 DIAGNOSIS — G8929 Other chronic pain: Secondary | ICD-10-CM | POA: Diagnosis not present

## 2022-09-26 DIAGNOSIS — M62512 Muscle wasting and atrophy, not elsewhere classified, left shoulder: Secondary | ICD-10-CM | POA: Diagnosis not present

## 2022-09-30 DIAGNOSIS — G8929 Other chronic pain: Secondary | ICD-10-CM | POA: Diagnosis not present

## 2022-09-30 DIAGNOSIS — M62512 Muscle wasting and atrophy, not elsewhere classified, left shoulder: Secondary | ICD-10-CM | POA: Diagnosis not present

## 2022-09-30 DIAGNOSIS — R2681 Unsteadiness on feet: Secondary | ICD-10-CM | POA: Diagnosis not present

## 2022-10-02 DIAGNOSIS — G8929 Other chronic pain: Secondary | ICD-10-CM | POA: Diagnosis not present

## 2022-10-02 DIAGNOSIS — R2681 Unsteadiness on feet: Secondary | ICD-10-CM | POA: Diagnosis not present

## 2022-10-02 DIAGNOSIS — M62512 Muscle wasting and atrophy, not elsewhere classified, left shoulder: Secondary | ICD-10-CM | POA: Diagnosis not present

## 2022-10-07 DIAGNOSIS — G8929 Other chronic pain: Secondary | ICD-10-CM | POA: Diagnosis not present

## 2022-10-07 DIAGNOSIS — R2681 Unsteadiness on feet: Secondary | ICD-10-CM | POA: Diagnosis not present

## 2022-10-07 DIAGNOSIS — M62512 Muscle wasting and atrophy, not elsewhere classified, left shoulder: Secondary | ICD-10-CM | POA: Diagnosis not present

## 2022-10-09 DIAGNOSIS — M62512 Muscle wasting and atrophy, not elsewhere classified, left shoulder: Secondary | ICD-10-CM | POA: Diagnosis not present

## 2022-10-09 DIAGNOSIS — G8929 Other chronic pain: Secondary | ICD-10-CM | POA: Diagnosis not present

## 2022-10-09 DIAGNOSIS — R2681 Unsteadiness on feet: Secondary | ICD-10-CM | POA: Diagnosis not present

## 2022-10-14 DIAGNOSIS — R2681 Unsteadiness on feet: Secondary | ICD-10-CM | POA: Diagnosis not present

## 2022-10-14 DIAGNOSIS — G8929 Other chronic pain: Secondary | ICD-10-CM | POA: Diagnosis not present

## 2022-10-14 DIAGNOSIS — M62512 Muscle wasting and atrophy, not elsewhere classified, left shoulder: Secondary | ICD-10-CM | POA: Diagnosis not present

## 2022-10-16 DIAGNOSIS — R2681 Unsteadiness on feet: Secondary | ICD-10-CM | POA: Diagnosis not present

## 2022-10-16 DIAGNOSIS — G8929 Other chronic pain: Secondary | ICD-10-CM | POA: Diagnosis not present

## 2022-10-16 DIAGNOSIS — M62512 Muscle wasting and atrophy, not elsewhere classified, left shoulder: Secondary | ICD-10-CM | POA: Diagnosis not present

## 2022-10-21 DIAGNOSIS — R2681 Unsteadiness on feet: Secondary | ICD-10-CM | POA: Diagnosis not present

## 2022-10-21 DIAGNOSIS — M62512 Muscle wasting and atrophy, not elsewhere classified, left shoulder: Secondary | ICD-10-CM | POA: Diagnosis not present

## 2022-10-21 DIAGNOSIS — G8929 Other chronic pain: Secondary | ICD-10-CM | POA: Diagnosis not present

## 2022-10-27 DIAGNOSIS — G8929 Other chronic pain: Secondary | ICD-10-CM | POA: Diagnosis not present

## 2022-10-27 DIAGNOSIS — R2681 Unsteadiness on feet: Secondary | ICD-10-CM | POA: Diagnosis not present

## 2022-10-27 DIAGNOSIS — M62512 Muscle wasting and atrophy, not elsewhere classified, left shoulder: Secondary | ICD-10-CM | POA: Diagnosis not present

## 2022-10-29 DIAGNOSIS — E1169 Type 2 diabetes mellitus with other specified complication: Secondary | ICD-10-CM | POA: Diagnosis not present

## 2022-10-30 DIAGNOSIS — G8929 Other chronic pain: Secondary | ICD-10-CM | POA: Diagnosis not present

## 2022-10-30 DIAGNOSIS — R2681 Unsteadiness on feet: Secondary | ICD-10-CM | POA: Diagnosis not present

## 2022-10-30 DIAGNOSIS — M62512 Muscle wasting and atrophy, not elsewhere classified, left shoulder: Secondary | ICD-10-CM | POA: Diagnosis not present

## 2022-10-31 DIAGNOSIS — Z1231 Encounter for screening mammogram for malignant neoplasm of breast: Secondary | ICD-10-CM | POA: Diagnosis not present

## 2022-11-04 ENCOUNTER — Other Ambulatory Visit: Payer: Self-pay | Admitting: *Deleted

## 2022-11-04 DIAGNOSIS — G8929 Other chronic pain: Secondary | ICD-10-CM | POA: Diagnosis not present

## 2022-11-04 DIAGNOSIS — R2681 Unsteadiness on feet: Secondary | ICD-10-CM | POA: Diagnosis not present

## 2022-11-04 DIAGNOSIS — M62512 Muscle wasting and atrophy, not elsewhere classified, left shoulder: Secondary | ICD-10-CM | POA: Diagnosis not present

## 2022-11-04 DIAGNOSIS — R252 Cramp and spasm: Secondary | ICD-10-CM

## 2022-11-06 DIAGNOSIS — M62512 Muscle wasting and atrophy, not elsewhere classified, left shoulder: Secondary | ICD-10-CM | POA: Diagnosis not present

## 2022-11-06 DIAGNOSIS — R2681 Unsteadiness on feet: Secondary | ICD-10-CM | POA: Diagnosis not present

## 2022-11-06 DIAGNOSIS — G8929 Other chronic pain: Secondary | ICD-10-CM | POA: Diagnosis not present

## 2022-11-11 DIAGNOSIS — G8929 Other chronic pain: Secondary | ICD-10-CM | POA: Diagnosis not present

## 2022-11-11 DIAGNOSIS — R2681 Unsteadiness on feet: Secondary | ICD-10-CM | POA: Diagnosis not present

## 2022-11-11 DIAGNOSIS — M62512 Muscle wasting and atrophy, not elsewhere classified, left shoulder: Secondary | ICD-10-CM | POA: Diagnosis not present

## 2022-11-12 ENCOUNTER — Ambulatory Visit (INDEPENDENT_AMBULATORY_CARE_PROVIDER_SITE_OTHER): Payer: Medicare Other | Admitting: Vascular Surgery

## 2022-11-12 ENCOUNTER — Encounter: Payer: Self-pay | Admitting: Vascular Surgery

## 2022-11-12 ENCOUNTER — Ambulatory Visit (HOSPITAL_COMMUNITY)
Admission: RE | Admit: 2022-11-12 | Discharge: 2022-11-12 | Disposition: A | Discharge status: Home / Self Care | Payer: Medicare Other | Source: Ambulatory Visit | Attending: Vascular Surgery | Admitting: Vascular Surgery

## 2022-11-12 VITALS — BP 131/75 | HR 61 | Temp 97.7°F | Resp 20 | Ht 63.0 in | Wt 190.0 lb

## 2022-11-12 DIAGNOSIS — M25562 Pain in left knee: Secondary | ICD-10-CM

## 2022-11-12 DIAGNOSIS — I6523 Occlusion and stenosis of bilateral carotid arteries: Secondary | ICD-10-CM | POA: Diagnosis not present

## 2022-11-12 DIAGNOSIS — M25561 Pain in right knee: Secondary | ICD-10-CM

## 2022-11-12 DIAGNOSIS — R252 Cramp and spasm: Secondary | ICD-10-CM | POA: Diagnosis not present

## 2022-11-12 DIAGNOSIS — E1169 Type 2 diabetes mellitus with other specified complication: Secondary | ICD-10-CM | POA: Diagnosis not present

## 2022-11-12 LAB — VAS US ABI WITH/WO TBI
Left ABI: 1.14
Right ABI: 1.18

## 2022-11-12 NOTE — Progress Notes (Signed)
Patient ID: Susan Randall, female   DOB: [birth year], 89 y.o.   MRN: [id number]  Reason for Consult: Follow-up   Referred by Renaye Rakers, MD  Subjective:     HPI:  Susan Randall is a 89 y.o. female with history of bilateral lower extremity coolness to touch.  She has also been evaluated for carotid stenosis in the past without any history of stroke, TIA or amaurosis.  She does have a history of hyperlipidemia and hypertension.  She is a lifelong non-smoker.  She does take aspirin and statin.  She denies any claudication, tissue loss or ulceration and she walks without limitation.  Past Medical History:  Diagnosis Date   Anxiety    Arthritis    Breast density    right   Cholesterol retinal embolus    Hyperlipidemia    Hypertension    Insomnia 07/07/2007   Lipid disorder    Obesity    Osteopenia    Osteoporosis    Family History  Problem Relation Age of Onset   Diabetes Mother    Heart disease Mother    Hypertension Mother    Past Surgical History:  Procedure Laterality Date   APPENDECTOMY  64    Short Social History:  Social History   Tobacco Use   Smoking status: Never   Smokeless tobacco: Never  Substance Use Topics   Alcohol use: Yes    Alcohol/week: 3.0 - 4.0 standard drinks of alcohol    Types: 3 - 4 Shots of liquor per week    No Known Allergies  Current Outpatient Medications  Medication Sig Dispense Refill   aspirin EC 81 MG tablet Take 1 tablet (81 mg total) by mouth daily. Swallow whole. 30 tablet 12   Cholecalciferol (VITAMIN D3) 1.25 MG (50000 UT) CAPS Take 1 capsule by mouth once a week.     diltiazem (TIAZAC) 180 MG 24 hr capsule Take 180 mg by mouth daily.     fexofenadine (ALLEGRA) 180 MG tablet Take 180 mg by mouth daily.     levothyroxine (SYNTHROID) 50 MCG tablet Take 1 tablet by mouth daily.     LINZESS 290 MCG CAPS capsule Take 290 mcg by mouth daily.     montelukast (SINGULAIR) 10 MG tablet Take 1 tablet by mouth at bedtime.      omega-3 acid ethyl esters (LOVAZA) 1 g capsule Take 1 g by mouth 2 (two) times daily.     simvastatin (ZOCOR) 40 MG tablet Take 40 mg by mouth every evening.     SitaGLIPtin-MetFORMIN HCl (JANUMET XR) 50-500 MG TB24 Take 1 tablet by mouth in the morning and at bedtime.     valsartan-hydrochlorothiazide (DIOVAN-HCT) 160-25 MG tablet Take 1 tablet by mouth daily.     No current facility-administered medications for this visit.    Review of Systems  Constitutional:  Constitutional negative. HENT: HENT negative.  Eyes: Eyes negative.  Respiratory: Respiratory negative.  Cardiovascular: Cardiovascular negative.  GI: Gastrointestinal negative.  Musculoskeletal:       Feet are cool Skin: Skin negative.  Neurological: Neurological negative. Hematologic: Hematologic/lymphatic negative.  Psychiatric: Psychiatric negative.        Objective:  Objective   Vitals:   11/12/22 1016  BP: 131/75  Pulse: 61  Resp: 20  Temp: 97.7 F (36.5 C)  SpO2: 95%  Weight: 190 lb (86.2 kg)  Height: 5\' 3"  (1.6 m)   Body mass index is 33.66 kg/m.  Physical Exam Constitutional:  Appearance: Normal appearance.  HENT:     Head: Normocephalic.     Nose: Nose normal.     Mouth/Throat:     Mouth: Mucous membranes are moist.  Eyes:     Pupils: Pupils are equal, round, and reactive to light.  Neck:     Vascular: No carotid bruit.  Cardiovascular:     Pulses: Normal pulses.     Heart sounds: Murmur heard.  Pulmonary:     Effort: Pulmonary effort is normal.  Abdominal:     General: Abdomen is flat.  Musculoskeletal:     Right lower leg: No edema.     Left lower leg: No edema.  Skin:    General: Skin is warm.     Capillary Refill: Capillary refill takes less than 2 seconds.  Neurological:     General: No focal deficit present.     Mental Status: She is alert.  Psychiatric:        Mood and Affect: Mood normal.     Data: ABI Findings:   +---------+------------------+-----+---------+--------+  Right   Rt Pressure (mmHg)IndexWaveform Comment   +---------+------------------+-----+---------+--------+  Brachial 133                                       +---------+------------------+-----+---------+--------+  PTA     138               1.04 triphasic          +---------+------------------+-----+---------+--------+  DP      157               1.18 triphasic          +---------+------------------+-----+---------+--------+  Great Toe105               0.79                    +---------+------------------+-----+---------+--------+   +---------+------------------+-----+---------+-------+  Left    Lt Pressure (mmHg)IndexWaveform Comment  +---------+------------------+-----+---------+-------+  Brachial 132                                      +---------+------------------+-----+---------+-------+  PTA     151               1.14 triphasic         +---------+------------------+-----+---------+-------+  DP      152               1.14 triphasic         +---------+------------------+-----+---------+-------+  Great Toe111               0.83                   +---------+------------------+-----+---------+-------+   +-------+-----------+-----------+------------+------------+  ABI/TBIToday's ABIToday's TBIPrevious ABIPrevious TBI  +-------+-----------+-----------+------------+------------+  Right 1.18       0.79       1.14        0.77          +-------+-----------+-----------+------------+------------+  Left  1.14       0.83       1.10        0.76          +-------+-----------+-----------+------------+------------+         Bilateral ABIs and TBIs appear essentially unchanged compared to prior  study on 12/19/2020.  Summary:  Right: Resting right ankle-brachial index is within normal range. The  right toe-brachial index is abnormal.   Left: Resting  left ankle-brachial index is within normal range. The left  toe-brachial index is abnormal.      Assessment/Plan:     89 year old female with history of carotid stenosis with feet that are cool to the touch and occasional leg pain but without any claudication with normal ABIs.  As such she can see me on an as-needed basis.     Maeola Harman MD [facility]

## 2022-11-19 DIAGNOSIS — E1169 Type 2 diabetes mellitus with other specified complication: Secondary | ICD-10-CM | POA: Diagnosis not present

## 2022-11-20 DIAGNOSIS — R2681 Unsteadiness on feet: Secondary | ICD-10-CM | POA: Diagnosis not present

## 2022-11-20 DIAGNOSIS — G8929 Other chronic pain: Secondary | ICD-10-CM | POA: Diagnosis not present

## 2022-11-20 DIAGNOSIS — M62512 Muscle wasting and atrophy, not elsewhere classified, left shoulder: Secondary | ICD-10-CM | POA: Diagnosis not present

## 2022-11-25 DIAGNOSIS — G8929 Other chronic pain: Secondary | ICD-10-CM | POA: Diagnosis not present

## 2022-11-25 DIAGNOSIS — R2681 Unsteadiness on feet: Secondary | ICD-10-CM | POA: Diagnosis not present

## 2022-11-25 DIAGNOSIS — M62512 Muscle wasting and atrophy, not elsewhere classified, left shoulder: Secondary | ICD-10-CM | POA: Diagnosis not present

## 2022-11-26 DIAGNOSIS — Z7985 Long-term (current) use of injectable non-insulin antidiabetic drugs: Secondary | ICD-10-CM | POA: Diagnosis not present

## 2022-11-26 DIAGNOSIS — E1169 Type 2 diabetes mellitus with other specified complication: Secondary | ICD-10-CM | POA: Diagnosis not present

## 2022-11-27 DIAGNOSIS — G8929 Other chronic pain: Secondary | ICD-10-CM | POA: Diagnosis not present

## 2022-11-27 DIAGNOSIS — M130 Polyarthritis, unspecified: Secondary | ICD-10-CM | POA: Diagnosis not present

## 2022-11-27 DIAGNOSIS — M62512 Muscle wasting and atrophy, not elsewhere classified, left shoulder: Secondary | ICD-10-CM | POA: Diagnosis not present

## 2022-11-27 DIAGNOSIS — R2681 Unsteadiness on feet: Secondary | ICD-10-CM | POA: Diagnosis not present

## 2022-11-27 DIAGNOSIS — L638 Other alopecia areata: Secondary | ICD-10-CM | POA: Diagnosis not present

## 2022-11-27 DIAGNOSIS — I110 Hypertensive heart disease with heart failure: Secondary | ICD-10-CM | POA: Diagnosis not present

## 2022-11-27 DIAGNOSIS — E1169 Type 2 diabetes mellitus with other specified complication: Secondary | ICD-10-CM | POA: Diagnosis not present

## 2022-11-27 DIAGNOSIS — E6609 Other obesity due to excess calories: Secondary | ICD-10-CM | POA: Diagnosis not present

## 2022-12-01 DIAGNOSIS — G8929 Other chronic pain: Secondary | ICD-10-CM | POA: Diagnosis not present

## 2022-12-01 DIAGNOSIS — R2681 Unsteadiness on feet: Secondary | ICD-10-CM | POA: Diagnosis not present

## 2022-12-01 DIAGNOSIS — M62512 Muscle wasting and atrophy, not elsewhere classified, left shoulder: Secondary | ICD-10-CM | POA: Diagnosis not present

## 2022-12-03 DIAGNOSIS — H40023 Open angle with borderline findings, high risk, bilateral: Secondary | ICD-10-CM | POA: Diagnosis not present

## 2022-12-03 DIAGNOSIS — H35032 Hypertensive retinopathy, left eye: Secondary | ICD-10-CM | POA: Diagnosis not present

## 2022-12-03 DIAGNOSIS — H35363 Drusen (degenerative) of macula, bilateral: Secondary | ICD-10-CM | POA: Diagnosis not present

## 2022-12-03 DIAGNOSIS — E1169 Type 2 diabetes mellitus with other specified complication: Secondary | ICD-10-CM | POA: Diagnosis not present

## 2022-12-03 DIAGNOSIS — H35362 Drusen (degenerative) of macula, left eye: Secondary | ICD-10-CM | POA: Diagnosis not present

## 2022-12-09 DIAGNOSIS — M62512 Muscle wasting and atrophy, not elsewhere classified, left shoulder: Secondary | ICD-10-CM | POA: Diagnosis not present

## 2022-12-09 DIAGNOSIS — R2681 Unsteadiness on feet: Secondary | ICD-10-CM | POA: Diagnosis not present

## 2022-12-09 DIAGNOSIS — G8929 Other chronic pain: Secondary | ICD-10-CM | POA: Diagnosis not present

## 2022-12-10 DIAGNOSIS — E1169 Type 2 diabetes mellitus with other specified complication: Secondary | ICD-10-CM | POA: Diagnosis not present

## 2022-12-10 DIAGNOSIS — Z7985 Long-term (current) use of injectable non-insulin antidiabetic drugs: Secondary | ICD-10-CM | POA: Diagnosis not present

## 2022-12-11 DIAGNOSIS — M62512 Muscle wasting and atrophy, not elsewhere classified, left shoulder: Secondary | ICD-10-CM | POA: Diagnosis not present

## 2022-12-11 DIAGNOSIS — G8929 Other chronic pain: Secondary | ICD-10-CM | POA: Diagnosis not present

## 2022-12-11 DIAGNOSIS — R2681 Unsteadiness on feet: Secondary | ICD-10-CM | POA: Diagnosis not present

## 2022-12-16 DIAGNOSIS — M62512 Muscle wasting and atrophy, not elsewhere classified, left shoulder: Secondary | ICD-10-CM | POA: Diagnosis not present

## 2022-12-16 DIAGNOSIS — G8929 Other chronic pain: Secondary | ICD-10-CM | POA: Diagnosis not present

## 2022-12-16 DIAGNOSIS — R2681 Unsteadiness on feet: Secondary | ICD-10-CM | POA: Diagnosis not present

## 2022-12-18 DIAGNOSIS — M62512 Muscle wasting and atrophy, not elsewhere classified, left shoulder: Secondary | ICD-10-CM | POA: Diagnosis not present

## 2022-12-18 DIAGNOSIS — R2681 Unsteadiness on feet: Secondary | ICD-10-CM | POA: Diagnosis not present

## 2022-12-18 DIAGNOSIS — G8929 Other chronic pain: Secondary | ICD-10-CM | POA: Diagnosis not present

## 2022-12-24 DIAGNOSIS — E1169 Type 2 diabetes mellitus with other specified complication: Secondary | ICD-10-CM | POA: Diagnosis not present

## 2022-12-24 DIAGNOSIS — Z7985 Long-term (current) use of injectable non-insulin antidiabetic drugs: Secondary | ICD-10-CM | POA: Diagnosis not present

## 2022-12-25 DIAGNOSIS — M62512 Muscle wasting and atrophy, not elsewhere classified, left shoulder: Secondary | ICD-10-CM | POA: Diagnosis not present

## 2022-12-25 DIAGNOSIS — R2681 Unsteadiness on feet: Secondary | ICD-10-CM | POA: Diagnosis not present

## 2022-12-25 DIAGNOSIS — G8929 Other chronic pain: Secondary | ICD-10-CM | POA: Diagnosis not present

## 2023-01-08 DIAGNOSIS — R2681 Unsteadiness on feet: Secondary | ICD-10-CM | POA: Diagnosis not present

## 2023-01-08 DIAGNOSIS — G8929 Other chronic pain: Secondary | ICD-10-CM | POA: Diagnosis not present

## 2023-01-08 DIAGNOSIS — M62512 Muscle wasting and atrophy, not elsewhere classified, left shoulder: Secondary | ICD-10-CM | POA: Diagnosis not present

## 2023-01-13 DIAGNOSIS — M62512 Muscle wasting and atrophy, not elsewhere classified, left shoulder: Secondary | ICD-10-CM | POA: Diagnosis not present

## 2023-01-13 DIAGNOSIS — R2681 Unsteadiness on feet: Secondary | ICD-10-CM | POA: Diagnosis not present

## 2023-01-13 DIAGNOSIS — G8929 Other chronic pain: Secondary | ICD-10-CM | POA: Diagnosis not present

## 2023-01-15 DIAGNOSIS — M62512 Muscle wasting and atrophy, not elsewhere classified, left shoulder: Secondary | ICD-10-CM | POA: Diagnosis not present

## 2023-01-15 DIAGNOSIS — G8929 Other chronic pain: Secondary | ICD-10-CM | POA: Diagnosis not present

## 2023-01-15 DIAGNOSIS — R2681 Unsteadiness on feet: Secondary | ICD-10-CM | POA: Diagnosis not present

## 2023-01-20 DIAGNOSIS — G8929 Other chronic pain: Secondary | ICD-10-CM | POA: Diagnosis not present

## 2023-01-20 DIAGNOSIS — R2681 Unsteadiness on feet: Secondary | ICD-10-CM | POA: Diagnosis not present

## 2023-01-20 DIAGNOSIS — M62512 Muscle wasting and atrophy, not elsewhere classified, left shoulder: Secondary | ICD-10-CM | POA: Diagnosis not present

## 2023-01-22 DIAGNOSIS — R2681 Unsteadiness on feet: Secondary | ICD-10-CM | POA: Diagnosis not present

## 2023-01-22 DIAGNOSIS — M62512 Muscle wasting and atrophy, not elsewhere classified, left shoulder: Secondary | ICD-10-CM | POA: Diagnosis not present

## 2023-01-22 DIAGNOSIS — G8929 Other chronic pain: Secondary | ICD-10-CM | POA: Diagnosis not present

## 2023-01-26 ENCOUNTER — Ambulatory Visit: Payer: BC Managed Care – PPO | Admitting: Nurse Practitioner

## 2023-01-27 DIAGNOSIS — M62512 Muscle wasting and atrophy, not elsewhere classified, left shoulder: Secondary | ICD-10-CM | POA: Diagnosis not present

## 2023-01-27 DIAGNOSIS — G8929 Other chronic pain: Secondary | ICD-10-CM | POA: Diagnosis not present

## 2023-01-27 DIAGNOSIS — R2681 Unsteadiness on feet: Secondary | ICD-10-CM | POA: Diagnosis not present

## 2023-01-29 DIAGNOSIS — R2681 Unsteadiness on feet: Secondary | ICD-10-CM | POA: Diagnosis not present

## 2023-01-29 DIAGNOSIS — M62512 Muscle wasting and atrophy, not elsewhere classified, left shoulder: Secondary | ICD-10-CM | POA: Diagnosis not present

## 2023-01-29 DIAGNOSIS — G8929 Other chronic pain: Secondary | ICD-10-CM | POA: Diagnosis not present

## 2023-02-03 DIAGNOSIS — J0140 Acute pansinusitis, unspecified: Secondary | ICD-10-CM | POA: Diagnosis not present

## 2023-02-03 DIAGNOSIS — Z6833 Body mass index (BMI) 33.0-33.9, adult: Secondary | ICD-10-CM | POA: Diagnosis not present

## 2023-02-03 NOTE — Progress Notes (Unsigned)
Cardiology Office Note    Patient Name: Susan Randall Date of Encounter: 02/03/2023  Primary Care Provider:  Renaye Rakers, MD Primary Cardiologist:  Kristeen Miss, MD Primary Electrophysiologist: None   Past Medical History    Past Medical History:  Diagnosis Date   Anxiety    Arthritis    Breast density    right   Cholesterol retinal embolus    Hyperlipidemia    Hypertension    Insomnia 07/07/2007   Lipid disorder    Obesity    Osteopenia    Osteoporosis     History of Present Illness  Susan Randall is a 89 y.o. female with a PMH of HTN, HLD, moderate to severe aortic stenosis, carotid stenosis (1-39% bilaterally), arthritis, anxiety who presents today for 47-month follow-up.  Ms. Stene was seen initially by Dr. Elease Hashimoto in 2023 for complaint of lower extremity swelling and heart murmur.  She denied any chest pain but endorsed some shortness of breath with walking.  She reported staying active and exercises at [facility] 6 times per week.  She completed 2D echo on 07/2021 that showed hyperdynamic EF of 70-75% with no RWMA and mild LVH trivial MVR and moderate calcification of AV and moderate aortic valve stenosis with mean gradient of 20.  She was advised to continue her current medications and was seen for follow-up visit on 07/29/2022 after hospital visit for strokelike symptoms with a negative workup.  She was evaluate did by neurology and was feeling better.  She was advised if symptoms occur regarding aortic stenosis she would be referred to TAVR team.  She completed ABIs and was seen by Dr. Randie Heinz for evaluation of cold lower extremities.  She was advised that ABIs were unchanged from previous study in 2022 will be followed as needed.  Ms. Gentry presents today for 78-month follow-up.  She reports since her previous visit that she has been doing well with no new cardiac complaints.  Her blood pressure today is controlled at 110/60 and heart rate was 70 bpm.  She reports  compliance with her current medication regimen and denies any adverse reactions. She reports that her allergies have been bothersome this year, despite the cold weather. The patient exercises six times a week and reports no symptoms of shortness of breath, chest pain, or dizziness. The patient's blood pressure is well controlled, and the patient is on medication for allergies. The patient's cholesterol levels are good, and the patient's blood sugar is under control.  Patient denies chest pain, palpitations, dyspnea, PND, orthopnea, nausea, vomiting, dizziness, syncope, edema, weight gain, or early satiety.  Review of Systems  Please see the history of present illness.    All other systems reviewed and are otherwise negative except as noted above.  Physical Exam    Wt Readings from Last 3 Encounters:  11/12/22 190 lb (86.2 kg)  07/29/22 192 lb 9.6 oz (87.4 kg)  07/20/22 193 lb (87.5 kg)   [id number] were no vitals filed for this visit.,There is no height or weight on file to calculate BMI. GEN: Well nourished, well developed in no acute distress Neck: No JVD; No carotid bruits Pulmonary: Clear to auscultation without rales, wheezing or rhonchi  Cardiovascular: Normal rate. Regular rhythm. Normal S1. Normal S2.   Murmurs:3/6 crescendo decrescendo systolic murmur ABDOMEN: Soft, non-tender, non-distended EXTREMITIES:  No edema; No deformity   EKG/LABS/ Recent Cardiac Studies   ECG personally reviewed by me today -sinus rhythm with rate of 64 bpm and no acute  changes consistent with previous EKG.  Risk Assessment/Calculations:          Lab Results  Component Value Date   WBC 9.7 07/21/2022   HGB 13.2 07/21/2022   HCT 41.5 07/21/2022   MCV 80.0 07/21/2022   PLT 217 07/21/2022   Lab Results  Component Value Date   CREATININE 1.02 (H) 07/21/2022   BUN 16 07/21/2022   NA 130 (L) 07/21/2022   K 3.6 07/21/2022   CL 96 (L) 07/21/2022   CO2 21 (L) 07/21/2022   Lab Results  Component  Value Date   CHOL 160 07/21/2022   HDL 67 07/21/2022   LDLCALC 82 07/21/2022   TRIG 54 07/21/2022   CHOLHDL 2.4 07/21/2022    Lab Results  Component Value Date   HGBA1C 6.6 (H) 07/21/2022   Assessment & Plan    1 Non rheumatic aortic stenosis: -Most recent 2D echo completed 07/2022 with severely calcified aortic valve with moderate to severe aortic stenosis - no symptoms of shortness of breath, chest pain, or dizziness. The patient is active and exercises regularly. -Continue monitoring with yearly surveillance echocardiogram. -Next echocardiogram scheduled for July 2025. -Advise patient to report any new symptoms of shortness of breath, chest pain, or dizziness.  2.  Essential hypertension: -Patient's blood pressure today was stable at 110/60 -Continue Cardizem 180 mg daily, Diovan-HCTZ 160-25 mg  3.  Hyperlipidemia: -Patient's last LDL cholesterol was 34 at goal -Continue Zocor 40 mg daily  4.  Bilateral carotid stenosis: -Mild 1-39% bilaterally with normal subclavian flow. Continue annual surveillance per vascular     Disposition: Follow-up with Kristeen Miss, MD or APP in 3 months    Signed, Napoleon Form, Leodis Rains, NP 02/03/2023, 12:28 PM [facility]

## 2023-02-04 ENCOUNTER — Encounter: Payer: Self-pay | Admitting: Nurse Practitioner

## 2023-02-04 ENCOUNTER — Ambulatory Visit: Payer: Medicare Other | Attending: Nurse Practitioner | Admitting: Nurse Practitioner

## 2023-02-04 VITALS — BP 110/60 | HR 70 | Ht 63.0 in | Wt 191.4 lb

## 2023-02-04 DIAGNOSIS — I10 Essential (primary) hypertension: Secondary | ICD-10-CM | POA: Diagnosis not present

## 2023-02-04 DIAGNOSIS — I350 Nonrheumatic aortic (valve) stenosis: Secondary | ICD-10-CM | POA: Insufficient documentation

## 2023-02-04 DIAGNOSIS — E785 Hyperlipidemia, unspecified: Secondary | ICD-10-CM | POA: Diagnosis not present

## 2023-02-04 DIAGNOSIS — I6523 Occlusion and stenosis of bilateral carotid arteries: Secondary | ICD-10-CM | POA: Diagnosis not present

## 2023-02-04 NOTE — Patient Instructions (Signed)
Medication Instructions:  No changes  *If you need a refill on your cardiac medications before your next appointment, please call your pharmacy*   Lab Work: None  If you have labs (blood work) drawn today and your tests are completely normal, you will receive your results only by: MyChart Message (if you have MyChart) OR A paper copy in the mail If you have any lab test that is abnormal or we need to change your treatment, we will call you to review the results.   Testing/Procedures: Echo to be scheduled in July 2025 Your physician has requested that you have an echocardiogram. Echocardiography is a painless test that uses sound waves to create images of your heart. It provides your doctor with information about the size and shape of your heart and how well your heart's chambers and valves are working. This procedure takes approximately one hour. There are no restrictions for this procedure. Please do NOT wear cologne, perfume, aftershave, or lotions (deodorant is allowed). Please arrive 15 minutes prior to your appointment time.  Please note: We ask at that you not bring children with you during ultrasound (echo/ vascular) testing. Due to room size and safety concerns, children are not allowed in the ultrasound rooms during exams. Our front office staff cannot provide observation of children in our lobby area while testing is being conducted. An adult accompanying a patient to their appointment will only be allowed in the ultrasound room at the discretion of the ultrasound technician under special circumstances. We apologize for any inconvenience.   Follow-Up: At [facility] you and your health needs are our priority.  As part of our continuing mission to provide you with exceptional heart care, we have created designated Provider Care Teams.  These Care Teams include your primary Cardiologist (physician) and Advanced Practice Providers (APPs -  Physician Assistants and Nurse  Practitioners) who all work together to provide you with the care you need, when you need it.  We recommend signing up for the patient portal called "MyChart".  Sign up information is provided on this After Visit Summary.  MyChart is used to connect with patients for Virtual Visits (Telemedicine).  Patients are able to view lab/test results, encounter notes, upcoming appointments, etc.  Non-urgent messages can be sent to your provider as well.   To learn more about what you can do with MyChart, go to [email or web address].    Your next appointment:   6 month(s)  Provider:   Kristeen Miss, MD     Other Instructions

## 2023-02-05 DIAGNOSIS — M62512 Muscle wasting and atrophy, not elsewhere classified, left shoulder: Secondary | ICD-10-CM | POA: Diagnosis not present

## 2023-02-05 DIAGNOSIS — R2681 Unsteadiness on feet: Secondary | ICD-10-CM | POA: Diagnosis not present

## 2023-02-05 DIAGNOSIS — G8929 Other chronic pain: Secondary | ICD-10-CM | POA: Diagnosis not present

## 2023-02-10 DIAGNOSIS — G8929 Other chronic pain: Secondary | ICD-10-CM | POA: Diagnosis not present

## 2023-02-10 DIAGNOSIS — M62512 Muscle wasting and atrophy, not elsewhere classified, left shoulder: Secondary | ICD-10-CM | POA: Diagnosis not present

## 2023-02-10 DIAGNOSIS — R2681 Unsteadiness on feet: Secondary | ICD-10-CM | POA: Diagnosis not present

## 2023-02-12 DIAGNOSIS — I10 Essential (primary) hypertension: Secondary | ICD-10-CM | POA: Diagnosis not present

## 2023-02-12 DIAGNOSIS — M62512 Muscle wasting and atrophy, not elsewhere classified, left shoulder: Secondary | ICD-10-CM | POA: Diagnosis not present

## 2023-02-12 DIAGNOSIS — E1169 Type 2 diabetes mellitus with other specified complication: Secondary | ICD-10-CM | POA: Diagnosis not present

## 2023-02-12 DIAGNOSIS — R2681 Unsteadiness on feet: Secondary | ICD-10-CM | POA: Diagnosis not present

## 2023-02-12 DIAGNOSIS — G8929 Other chronic pain: Secondary | ICD-10-CM | POA: Diagnosis not present

## 2023-02-12 DIAGNOSIS — K5900 Constipation, unspecified: Secondary | ICD-10-CM | POA: Diagnosis not present

## 2023-02-19 DIAGNOSIS — R2681 Unsteadiness on feet: Secondary | ICD-10-CM | POA: Diagnosis not present

## 2023-02-19 DIAGNOSIS — M62512 Muscle wasting and atrophy, not elsewhere classified, left shoulder: Secondary | ICD-10-CM | POA: Diagnosis not present

## 2023-02-19 DIAGNOSIS — G8929 Other chronic pain: Secondary | ICD-10-CM | POA: Diagnosis not present

## 2023-02-24 DIAGNOSIS — G8929 Other chronic pain: Secondary | ICD-10-CM | POA: Diagnosis not present

## 2023-02-24 DIAGNOSIS — M62512 Muscle wasting and atrophy, not elsewhere classified, left shoulder: Secondary | ICD-10-CM | POA: Diagnosis not present

## 2023-02-24 DIAGNOSIS — R2681 Unsteadiness on feet: Secondary | ICD-10-CM | POA: Diagnosis not present

## 2023-02-26 DIAGNOSIS — G8929 Other chronic pain: Secondary | ICD-10-CM | POA: Diagnosis not present

## 2023-02-26 DIAGNOSIS — R2681 Unsteadiness on feet: Secondary | ICD-10-CM | POA: Diagnosis not present

## 2023-02-26 DIAGNOSIS — M62512 Muscle wasting and atrophy, not elsewhere classified, left shoulder: Secondary | ICD-10-CM | POA: Diagnosis not present

## 2023-03-03 DIAGNOSIS — R2681 Unsteadiness on feet: Secondary | ICD-10-CM | POA: Diagnosis not present

## 2023-03-03 DIAGNOSIS — G8929 Other chronic pain: Secondary | ICD-10-CM | POA: Diagnosis not present

## 2023-03-03 DIAGNOSIS — M62512 Muscle wasting and atrophy, not elsewhere classified, left shoulder: Secondary | ICD-10-CM | POA: Diagnosis not present

## 2023-03-05 DIAGNOSIS — G8929 Other chronic pain: Secondary | ICD-10-CM | POA: Diagnosis not present

## 2023-03-05 DIAGNOSIS — R2681 Unsteadiness on feet: Secondary | ICD-10-CM | POA: Diagnosis not present

## 2023-03-05 DIAGNOSIS — M62512 Muscle wasting and atrophy, not elsewhere classified, left shoulder: Secondary | ICD-10-CM | POA: Diagnosis not present

## 2023-03-10 DIAGNOSIS — M62512 Muscle wasting and atrophy, not elsewhere classified, left shoulder: Secondary | ICD-10-CM | POA: Diagnosis not present

## 2023-03-10 DIAGNOSIS — M65331 Trigger finger, right middle finger: Secondary | ICD-10-CM | POA: Diagnosis not present

## 2023-03-10 DIAGNOSIS — G8929 Other chronic pain: Secondary | ICD-10-CM | POA: Diagnosis not present

## 2023-03-10 DIAGNOSIS — R2681 Unsteadiness on feet: Secondary | ICD-10-CM | POA: Diagnosis not present

## 2023-03-10 DIAGNOSIS — M65341 Trigger finger, right ring finger: Secondary | ICD-10-CM | POA: Diagnosis not present

## 2023-03-12 DIAGNOSIS — G8929 Other chronic pain: Secondary | ICD-10-CM | POA: Diagnosis not present

## 2023-03-12 DIAGNOSIS — M62512 Muscle wasting and atrophy, not elsewhere classified, left shoulder: Secondary | ICD-10-CM | POA: Diagnosis not present

## 2023-03-12 DIAGNOSIS — E1169 Type 2 diabetes mellitus with other specified complication: Secondary | ICD-10-CM | POA: Diagnosis not present

## 2023-03-12 DIAGNOSIS — I10 Essential (primary) hypertension: Secondary | ICD-10-CM | POA: Diagnosis not present

## 2023-03-12 DIAGNOSIS — R2681 Unsteadiness on feet: Secondary | ICD-10-CM | POA: Diagnosis not present

## 2023-03-17 DIAGNOSIS — R2681 Unsteadiness on feet: Secondary | ICD-10-CM | POA: Diagnosis not present

## 2023-03-17 DIAGNOSIS — M62512 Muscle wasting and atrophy, not elsewhere classified, left shoulder: Secondary | ICD-10-CM | POA: Diagnosis not present

## 2023-03-17 DIAGNOSIS — G8929 Other chronic pain: Secondary | ICD-10-CM | POA: Diagnosis not present

## 2023-03-19 DIAGNOSIS — R2681 Unsteadiness on feet: Secondary | ICD-10-CM | POA: Diagnosis not present

## 2023-03-19 DIAGNOSIS — M62512 Muscle wasting and atrophy, not elsewhere classified, left shoulder: Secondary | ICD-10-CM | POA: Diagnosis not present

## 2023-03-19 DIAGNOSIS — G8929 Other chronic pain: Secondary | ICD-10-CM | POA: Diagnosis not present

## 2023-03-24 DIAGNOSIS — M62512 Muscle wasting and atrophy, not elsewhere classified, left shoulder: Secondary | ICD-10-CM | POA: Diagnosis not present

## 2023-03-24 DIAGNOSIS — R2681 Unsteadiness on feet: Secondary | ICD-10-CM | POA: Diagnosis not present

## 2023-03-24 DIAGNOSIS — G8929 Other chronic pain: Secondary | ICD-10-CM | POA: Diagnosis not present

## 2023-03-31 DIAGNOSIS — G8929 Other chronic pain: Secondary | ICD-10-CM | POA: Diagnosis not present

## 2023-03-31 DIAGNOSIS — R2681 Unsteadiness on feet: Secondary | ICD-10-CM | POA: Diagnosis not present

## 2023-03-31 DIAGNOSIS — M62512 Muscle wasting and atrophy, not elsewhere classified, left shoulder: Secondary | ICD-10-CM | POA: Diagnosis not present

## 2023-04-09 DIAGNOSIS — R2681 Unsteadiness on feet: Secondary | ICD-10-CM | POA: Diagnosis not present

## 2023-04-09 DIAGNOSIS — G8929 Other chronic pain: Secondary | ICD-10-CM | POA: Diagnosis not present

## 2023-04-09 DIAGNOSIS — M62512 Muscle wasting and atrophy, not elsewhere classified, left shoulder: Secondary | ICD-10-CM | POA: Diagnosis not present

## 2023-04-14 DIAGNOSIS — M62512 Muscle wasting and atrophy, not elsewhere classified, left shoulder: Secondary | ICD-10-CM | POA: Diagnosis not present

## 2023-04-14 DIAGNOSIS — G8929 Other chronic pain: Secondary | ICD-10-CM | POA: Diagnosis not present

## 2023-04-14 DIAGNOSIS — R2681 Unsteadiness on feet: Secondary | ICD-10-CM | POA: Diagnosis not present

## 2023-04-16 DIAGNOSIS — Z6833 Body mass index (BMI) 33.0-33.9, adult: Secondary | ICD-10-CM | POA: Diagnosis not present

## 2023-04-16 DIAGNOSIS — I10 Essential (primary) hypertension: Secondary | ICD-10-CM | POA: Diagnosis not present

## 2023-04-16 DIAGNOSIS — E1169 Type 2 diabetes mellitus with other specified complication: Secondary | ICD-10-CM | POA: Diagnosis not present

## 2023-04-16 DIAGNOSIS — R7309 Other abnormal glucose: Secondary | ICD-10-CM | POA: Diagnosis not present

## 2023-04-16 DIAGNOSIS — Q230 Congenital stenosis of aortic valve: Secondary | ICD-10-CM | POA: Diagnosis not present

## 2023-04-16 DIAGNOSIS — K5909 Other constipation: Secondary | ICD-10-CM | POA: Diagnosis not present

## 2023-04-21 DIAGNOSIS — M62512 Muscle wasting and atrophy, not elsewhere classified, left shoulder: Secondary | ICD-10-CM | POA: Diagnosis not present

## 2023-04-21 DIAGNOSIS — G8929 Other chronic pain: Secondary | ICD-10-CM | POA: Diagnosis not present

## 2023-04-21 DIAGNOSIS — R2681 Unsteadiness on feet: Secondary | ICD-10-CM | POA: Diagnosis not present

## 2023-04-23 ENCOUNTER — Other Ambulatory Visit: Payer: Self-pay | Admitting: Family Medicine

## 2023-04-23 ENCOUNTER — Ambulatory Visit
Admission: RE | Admit: 2023-04-23 | Discharge: 2023-04-23 | Disposition: A | Discharge status: Home / Self Care | Source: Ambulatory Visit | Attending: Family Medicine | Admitting: Family Medicine

## 2023-04-23 DIAGNOSIS — M79671 Pain in right foot: Secondary | ICD-10-CM | POA: Diagnosis not present

## 2023-04-23 DIAGNOSIS — M1990 Unspecified osteoarthritis, unspecified site: Secondary | ICD-10-CM

## 2023-04-29 DIAGNOSIS — G8929 Other chronic pain: Secondary | ICD-10-CM | POA: Diagnosis not present

## 2023-04-29 DIAGNOSIS — M62512 Muscle wasting and atrophy, not elsewhere classified, left shoulder: Secondary | ICD-10-CM | POA: Diagnosis not present

## 2023-04-29 DIAGNOSIS — R2681 Unsteadiness on feet: Secondary | ICD-10-CM | POA: Diagnosis not present

## 2023-05-04 DIAGNOSIS — M130 Polyarthritis, unspecified: Secondary | ICD-10-CM | POA: Diagnosis not present

## 2023-05-04 DIAGNOSIS — I10 Essential (primary) hypertension: Secondary | ICD-10-CM | POA: Diagnosis not present

## 2023-05-04 DIAGNOSIS — E6609 Other obesity due to excess calories: Secondary | ICD-10-CM | POA: Diagnosis not present

## 2023-05-04 DIAGNOSIS — E1169 Type 2 diabetes mellitus with other specified complication: Secondary | ICD-10-CM | POA: Diagnosis not present

## 2023-05-07 DIAGNOSIS — R2681 Unsteadiness on feet: Secondary | ICD-10-CM | POA: Diagnosis not present

## 2023-05-07 DIAGNOSIS — G8929 Other chronic pain: Secondary | ICD-10-CM | POA: Diagnosis not present

## 2023-05-07 DIAGNOSIS — M79672 Pain in left foot: Secondary | ICD-10-CM | POA: Diagnosis not present

## 2023-05-07 DIAGNOSIS — M62512 Muscle wasting and atrophy, not elsewhere classified, left shoulder: Secondary | ICD-10-CM | POA: Diagnosis not present

## 2023-05-07 DIAGNOSIS — M722 Plantar fascial fibromatosis: Secondary | ICD-10-CM | POA: Diagnosis not present

## 2023-05-07 DIAGNOSIS — M79671 Pain in right foot: Secondary | ICD-10-CM | POA: Diagnosis not present

## 2023-05-11 ENCOUNTER — Ambulatory Visit (INDEPENDENT_AMBULATORY_CARE_PROVIDER_SITE_OTHER)

## 2023-05-11 ENCOUNTER — Ambulatory Visit (INDEPENDENT_AMBULATORY_CARE_PROVIDER_SITE_OTHER): Admitting: Podiatry

## 2023-05-11 DIAGNOSIS — M722 Plantar fascial fibromatosis: Secondary | ICD-10-CM

## 2023-05-11 DIAGNOSIS — M21612 Bunion of left foot: Secondary | ICD-10-CM | POA: Diagnosis not present

## 2023-05-11 DIAGNOSIS — M21619 Bunion of unspecified foot: Secondary | ICD-10-CM

## 2023-05-11 DIAGNOSIS — M21611 Bunion of right foot: Secondary | ICD-10-CM | POA: Diagnosis not present

## 2023-05-11 NOTE — Progress Notes (Unsigned)
 Chief Complaint  Patient presents with   Bunions    Right foot bunion. 3 pain when walking for 6 mos. Tylenol  for treatment. Non diabetic.    HPI: 89 y.o. female presenting today with c/o pain in the bottom of the bilateral heel.  She also notes bunions on both feet and would like to know what she can do for this.  Patient had x-rays of both feet recently but does not have the images with her today.  She notes that she has cold feet.  She gets relief with thicker socks at home.  She does take aspirin  daily  Past Medical History:  Diagnosis Date   Anxiety    Arthritis    Breast density    right   Cholesterol retinal embolus    Hyperlipidemia    Hypertension    Insomnia 07/07/2007   Lipid disorder    Obesity    Osteopenia    Osteoporosis     Past Surgical History:  Procedure Laterality Date   APPENDECTOMY  1944    No Known Allergies   Physical Exam: General: The patient is alert and oriented x3 in no acute distress.  Dermatology:  No ecchymosis, erythema, or edema bilateral.  No open lesions.    Vascular: Palpable pedal pulses bilaterally. Capillary refill within normal limits.  No appreciable edema.    Neurological: Light touch sensation intact bilateral.  MMT 5/5 to lower extremity bilateral. Negative Tinel's sign with percussion of the posterior tibial nerve on the affected extremity.    Musculoskeletal Exam:  There is pain on palpation of the plantarmedial & plantarcentral aspect of bilateral heel.  No gaps or nodules within the plantar fascia.  Positive Windlass mechanism bilateral.  Antalgic gait noted with first few steps upon standing.  No pain on palpation of achilles tendon bilateral.  Ankle df less than 10 degrees with knee extended b/l.  Radiographic Exam (bilateral foot, 3 weightbearing views, 04/23/2023 from [facility] imaging on [facility]):  Decreased osseous mineralization throughout the bones of the feet.  Short first metatarsal and bilateral  foot with joint space narrowing at the first MPJ.  Increased first intermetatarsal angle inferior calcaneal spur noted bilateral.  Assessment/Plan of Care: 1. Plantar fasciitis, bilateral   2. Bunion, right foot   3. Bunion, left foot     AMB REFERRAL TO PHYSICAL THERAPY  -Reviewed etiology of plantar fasciitis with patient.  Discussed treatment options with patient today, including cortisone injection, NSAID course of treatment, stretching exercises, physical therapy, use of night splint, rest, icing the heel, arch supports/orthotics, and supportive shoe gear.    Patient referred to physical therapy for the bilateral plantar fasciitis.  They can evaluate and treat accordingly.  Dispensed gel toe spacers for the bilateral bunions.  Patient informed that these are mild.  Do not recommend surgery at this time.  Discussed accommodative shoe gear.  Patient informed that although her toes feel cold to her all the time, they were not cold on exam today.  This feeling of cold feet may be due to a combination of thinner skin and subcutaneous fat atrophy and aspirin  use.  She will continue with her thicker socks at home to keep her feet warm.  Return if symptoms worsen or fail to improve.   Joe Murders, DPM, FACFAS [facility]     [address]  [place], [place] [address]                Office [phone]  Fax [phone]

## 2023-05-26 DIAGNOSIS — M65331 Trigger finger, right middle finger: Secondary | ICD-10-CM | POA: Diagnosis not present

## 2023-05-28 DIAGNOSIS — G8929 Other chronic pain: Secondary | ICD-10-CM | POA: Diagnosis not present

## 2023-05-28 DIAGNOSIS — M79672 Pain in left foot: Secondary | ICD-10-CM | POA: Diagnosis not present

## 2023-05-28 DIAGNOSIS — M62512 Muscle wasting and atrophy, not elsewhere classified, left shoulder: Secondary | ICD-10-CM | POA: Diagnosis not present

## 2023-05-28 DIAGNOSIS — M79671 Pain in right foot: Secondary | ICD-10-CM | POA: Diagnosis not present

## 2023-05-28 DIAGNOSIS — M722 Plantar fascial fibromatosis: Secondary | ICD-10-CM | POA: Diagnosis not present

## 2023-05-28 DIAGNOSIS — R2681 Unsteadiness on feet: Secondary | ICD-10-CM | POA: Diagnosis not present

## 2023-06-02 DIAGNOSIS — M62512 Muscle wasting and atrophy, not elsewhere classified, left shoulder: Secondary | ICD-10-CM | POA: Diagnosis not present

## 2023-06-02 DIAGNOSIS — M79672 Pain in left foot: Secondary | ICD-10-CM | POA: Diagnosis not present

## 2023-06-02 DIAGNOSIS — R2681 Unsteadiness on feet: Secondary | ICD-10-CM | POA: Diagnosis not present

## 2023-06-02 DIAGNOSIS — G8929 Other chronic pain: Secondary | ICD-10-CM | POA: Diagnosis not present

## 2023-06-02 DIAGNOSIS — M722 Plantar fascial fibromatosis: Secondary | ICD-10-CM | POA: Diagnosis not present

## 2023-06-02 DIAGNOSIS — M79671 Pain in right foot: Secondary | ICD-10-CM | POA: Diagnosis not present

## 2023-06-04 DIAGNOSIS — R2681 Unsteadiness on feet: Secondary | ICD-10-CM | POA: Diagnosis not present

## 2023-06-04 DIAGNOSIS — M722 Plantar fascial fibromatosis: Secondary | ICD-10-CM | POA: Diagnosis not present

## 2023-06-04 DIAGNOSIS — M79671 Pain in right foot: Secondary | ICD-10-CM | POA: Diagnosis not present

## 2023-06-04 DIAGNOSIS — M62512 Muscle wasting and atrophy, not elsewhere classified, left shoulder: Secondary | ICD-10-CM | POA: Diagnosis not present

## 2023-06-04 DIAGNOSIS — M79672 Pain in left foot: Secondary | ICD-10-CM | POA: Diagnosis not present

## 2023-06-04 DIAGNOSIS — G8929 Other chronic pain: Secondary | ICD-10-CM | POA: Diagnosis not present

## 2023-06-09 DIAGNOSIS — Z23 Encounter for immunization: Secondary | ICD-10-CM | POA: Diagnosis not present

## 2023-06-11 DIAGNOSIS — M722 Plantar fascial fibromatosis: Secondary | ICD-10-CM | POA: Diagnosis not present

## 2023-06-11 DIAGNOSIS — M79671 Pain in right foot: Secondary | ICD-10-CM | POA: Diagnosis not present

## 2023-06-11 DIAGNOSIS — M79672 Pain in left foot: Secondary | ICD-10-CM | POA: Diagnosis not present

## 2023-06-15 DIAGNOSIS — E782 Mixed hyperlipidemia: Secondary | ICD-10-CM | POA: Diagnosis not present

## 2023-06-15 DIAGNOSIS — R635 Abnormal weight gain: Secondary | ICD-10-CM | POA: Diagnosis not present

## 2023-06-15 DIAGNOSIS — E0800 Diabetes mellitus due to underlying condition with hyperosmolarity without nonketotic hyperglycemic-hyperosmolar coma (NKHHC): Secondary | ICD-10-CM | POA: Diagnosis not present

## 2023-06-15 DIAGNOSIS — I10 Essential (primary) hypertension: Secondary | ICD-10-CM | POA: Diagnosis not present

## 2023-06-15 DIAGNOSIS — M130 Polyarthritis, unspecified: Secondary | ICD-10-CM | POA: Diagnosis not present

## 2023-06-15 DIAGNOSIS — M549 Dorsalgia, unspecified: Secondary | ICD-10-CM | POA: Diagnosis not present

## 2023-06-15 DIAGNOSIS — Z6833 Body mass index (BMI) 33.0-33.9, adult: Secondary | ICD-10-CM | POA: Diagnosis not present

## 2023-06-15 DIAGNOSIS — E1169 Type 2 diabetes mellitus with other specified complication: Secondary | ICD-10-CM | POA: Diagnosis not present

## 2023-06-16 ENCOUNTER — Ambulatory Visit
Admission: RE | Admit: 2023-06-16 | Discharge: 2023-06-16 | Disposition: A | Discharge status: Home / Self Care | Source: Ambulatory Visit | Attending: Family Medicine | Admitting: Family Medicine

## 2023-06-16 ENCOUNTER — Other Ambulatory Visit: Payer: Self-pay | Admitting: Family Medicine

## 2023-06-16 DIAGNOSIS — Z96612 Presence of left artificial shoulder joint: Secondary | ICD-10-CM | POA: Diagnosis not present

## 2023-06-16 DIAGNOSIS — M79621 Pain in right upper arm: Secondary | ICD-10-CM | POA: Diagnosis not present

## 2023-06-16 DIAGNOSIS — M79622 Pain in left upper arm: Secondary | ICD-10-CM | POA: Diagnosis not present

## 2023-06-16 DIAGNOSIS — M130 Polyarthritis, unspecified: Secondary | ICD-10-CM

## 2023-06-16 DIAGNOSIS — M419 Scoliosis, unspecified: Secondary | ICD-10-CM | POA: Diagnosis not present

## 2023-06-16 DIAGNOSIS — G8929 Other chronic pain: Secondary | ICD-10-CM | POA: Diagnosis not present

## 2023-06-16 DIAGNOSIS — M4316 Spondylolisthesis, lumbar region: Secondary | ICD-10-CM | POA: Diagnosis not present

## 2023-06-29 DIAGNOSIS — M4122 Other idiopathic scoliosis, cervical region: Secondary | ICD-10-CM | POA: Diagnosis not present

## 2023-06-29 DIAGNOSIS — M4126 Other idiopathic scoliosis, lumbar region: Secondary | ICD-10-CM | POA: Diagnosis not present

## 2023-07-03 DIAGNOSIS — M79671 Pain in right foot: Secondary | ICD-10-CM | POA: Diagnosis not present

## 2023-07-03 DIAGNOSIS — M79672 Pain in left foot: Secondary | ICD-10-CM | POA: Diagnosis not present

## 2023-07-03 DIAGNOSIS — M722 Plantar fascial fibromatosis: Secondary | ICD-10-CM | POA: Diagnosis not present

## 2023-07-07 ENCOUNTER — Ambulatory Visit (HOSPITAL_COMMUNITY)
Admission: RE | Admit: 2023-07-07 | Discharge: 2023-07-07 | Disposition: A | Discharge status: Home / Self Care | Payer: BC Managed Care – PPO | Source: Ambulatory Visit | Attending: Cardiology | Admitting: Cardiology

## 2023-07-07 DIAGNOSIS — I350 Nonrheumatic aortic (valve) stenosis: Secondary | ICD-10-CM

## 2023-07-07 DIAGNOSIS — I6523 Occlusion and stenosis of bilateral carotid arteries: Secondary | ICD-10-CM | POA: Diagnosis not present

## 2023-07-09 DIAGNOSIS — M79672 Pain in left foot: Secondary | ICD-10-CM | POA: Diagnosis not present

## 2023-07-09 DIAGNOSIS — M722 Plantar fascial fibromatosis: Secondary | ICD-10-CM | POA: Diagnosis not present

## 2023-07-09 DIAGNOSIS — M79671 Pain in right foot: Secondary | ICD-10-CM | POA: Diagnosis not present

## 2023-07-09 LAB — ECHOCARDIOGRAM COMPLETE
AR max vel: 1.05 cm2
AV Area VTI: 1.18 cm2
AV Area mean vel: 1.12 cm2
AV Mean grad: 19.3 mmHg
AV Peak grad: 34.4 mmHg
Ao pk vel: 2.93 m/s
Area-P 1/2: 2.63 cm2
S' Lateral: 2.8 cm

## 2023-07-13 ENCOUNTER — Ambulatory Visit: Payer: Self-pay | Admitting: Nurse Practitioner

## 2023-07-14 DIAGNOSIS — M722 Plantar fascial fibromatosis: Secondary | ICD-10-CM | POA: Diagnosis not present

## 2023-07-14 DIAGNOSIS — M79671 Pain in right foot: Secondary | ICD-10-CM | POA: Diagnosis not present

## 2023-07-14 DIAGNOSIS — M79672 Pain in left foot: Secondary | ICD-10-CM | POA: Diagnosis not present

## 2023-07-16 DIAGNOSIS — M79672 Pain in left foot: Secondary | ICD-10-CM | POA: Diagnosis not present

## 2023-07-16 DIAGNOSIS — M722 Plantar fascial fibromatosis: Secondary | ICD-10-CM | POA: Diagnosis not present

## 2023-07-16 DIAGNOSIS — M79671 Pain in right foot: Secondary | ICD-10-CM | POA: Diagnosis not present

## 2023-07-21 DIAGNOSIS — M79672 Pain in left foot: Secondary | ICD-10-CM | POA: Diagnosis not present

## 2023-07-21 DIAGNOSIS — M79671 Pain in right foot: Secondary | ICD-10-CM | POA: Diagnosis not present

## 2023-07-21 DIAGNOSIS — M722 Plantar fascial fibromatosis: Secondary | ICD-10-CM | POA: Diagnosis not present

## 2023-07-23 DIAGNOSIS — M79672 Pain in left foot: Secondary | ICD-10-CM | POA: Diagnosis not present

## 2023-07-23 DIAGNOSIS — M722 Plantar fascial fibromatosis: Secondary | ICD-10-CM | POA: Diagnosis not present

## 2023-07-23 DIAGNOSIS — M79671 Pain in right foot: Secondary | ICD-10-CM | POA: Diagnosis not present

## 2023-07-26 DIAGNOSIS — M79672 Pain in left foot: Secondary | ICD-10-CM | POA: Diagnosis not present

## 2023-07-26 DIAGNOSIS — M722 Plantar fascial fibromatosis: Secondary | ICD-10-CM | POA: Diagnosis not present

## 2023-07-26 DIAGNOSIS — M79671 Pain in right foot: Secondary | ICD-10-CM | POA: Diagnosis not present

## 2023-07-28 DIAGNOSIS — M79671 Pain in right foot: Secondary | ICD-10-CM | POA: Diagnosis not present

## 2023-07-28 DIAGNOSIS — M722 Plantar fascial fibromatosis: Secondary | ICD-10-CM | POA: Diagnosis not present

## 2023-07-28 DIAGNOSIS — M79672 Pain in left foot: Secondary | ICD-10-CM | POA: Diagnosis not present

## 2023-08-12 DIAGNOSIS — M79671 Pain in right foot: Secondary | ICD-10-CM | POA: Diagnosis not present

## 2023-08-12 DIAGNOSIS — M722 Plantar fascial fibromatosis: Secondary | ICD-10-CM | POA: Diagnosis not present

## 2023-08-12 DIAGNOSIS — M79672 Pain in left foot: Secondary | ICD-10-CM | POA: Diagnosis not present

## 2023-08-12 DIAGNOSIS — M65331 Trigger finger, right middle finger: Secondary | ICD-10-CM | POA: Diagnosis not present

## 2023-08-13 ENCOUNTER — Other Ambulatory Visit: Payer: Self-pay | Admitting: Orthopedic Surgery

## 2023-08-13 DIAGNOSIS — M79672 Pain in left foot: Secondary | ICD-10-CM | POA: Diagnosis not present

## 2023-08-13 DIAGNOSIS — M79671 Pain in right foot: Secondary | ICD-10-CM | POA: Diagnosis not present

## 2023-08-13 DIAGNOSIS — M722 Plantar fascial fibromatosis: Secondary | ICD-10-CM | POA: Diagnosis not present

## 2023-08-25 ENCOUNTER — Other Ambulatory Visit: Payer: Self-pay

## 2023-08-25 ENCOUNTER — Encounter (HOSPITAL_BASED_OUTPATIENT_CLINIC_OR_DEPARTMENT_OTHER): Payer: Self-pay | Admitting: Orthopedic Surgery

## 2023-08-31 ENCOUNTER — Encounter (HOSPITAL_BASED_OUTPATIENT_CLINIC_OR_DEPARTMENT_OTHER): Payer: Self-pay | Admitting: Anesthesiology

## 2023-08-31 ENCOUNTER — Encounter (HOSPITAL_BASED_OUTPATIENT_CLINIC_OR_DEPARTMENT_OTHER)
Admission: RE | Disposition: A | Discharge status: Home / Self Care | Payer: Self-pay | Source: Home / Self Care | Attending: Orthopedic Surgery

## 2023-08-31 ENCOUNTER — Ambulatory Visit (HOSPITAL_BASED_OUTPATIENT_CLINIC_OR_DEPARTMENT_OTHER)
Admission: RE | Admit: 2023-08-31 | Discharge: 2023-08-31 | Disposition: A | Discharge status: Home / Self Care | Attending: Orthopedic Surgery | Admitting: Orthopedic Surgery

## 2023-08-31 ENCOUNTER — Encounter (HOSPITAL_BASED_OUTPATIENT_CLINIC_OR_DEPARTMENT_OTHER): Payer: Self-pay | Admitting: Orthopedic Surgery

## 2023-08-31 DIAGNOSIS — Z6834 Body mass index (BMI) 34.0-34.9, adult: Secondary | ICD-10-CM | POA: Diagnosis not present

## 2023-08-31 DIAGNOSIS — E669 Obesity, unspecified: Secondary | ICD-10-CM | POA: Insufficient documentation

## 2023-08-31 DIAGNOSIS — Z79890 Hormone replacement therapy (postmenopausal): Secondary | ICD-10-CM | POA: Insufficient documentation

## 2023-08-31 DIAGNOSIS — Z7982 Long term (current) use of aspirin: Secondary | ICD-10-CM | POA: Insufficient documentation

## 2023-08-31 DIAGNOSIS — I10 Essential (primary) hypertension: Secondary | ICD-10-CM | POA: Diagnosis not present

## 2023-08-31 DIAGNOSIS — E785 Hyperlipidemia, unspecified: Secondary | ICD-10-CM | POA: Diagnosis not present

## 2023-08-31 DIAGNOSIS — Z79899 Other long term (current) drug therapy: Secondary | ICD-10-CM | POA: Diagnosis not present

## 2023-08-31 DIAGNOSIS — M65331 Trigger finger, right middle finger: Secondary | ICD-10-CM | POA: Diagnosis not present

## 2023-08-31 HISTORY — PX: TRIGGER FINGER RELEASE: SHX641

## 2023-08-31 HISTORY — DX: Nonrheumatic aortic (valve) stenosis: I35.0

## 2023-08-31 SURGERY — RELEASE, A1 PULLEY, FOR TRIGGER FINGER
Anesthesia: LOCAL | Site: Middle Finger | Laterality: Right

## 2023-08-31 MED ORDER — LIDOCAINE HCL 1 % IJ SOLN
INTRAMUSCULAR | Status: DC | PRN
  Administered 2023-08-31: 9 mL via SURGICAL_CAVITY

## 2023-08-31 MED ORDER — LIDOCAINE HCL (PF) 1 % IJ SOLN
INTRAMUSCULAR | Status: AC
  Filled 2023-08-31: qty 30

## 2023-08-31 MED ORDER — BUPIVACAINE HCL (PF) 0.25 % IJ SOLN
INTRAMUSCULAR | Status: AC
  Filled 2023-08-31: qty 30

## 2023-08-31 MED ORDER — 0.9 % SODIUM CHLORIDE (POUR BTL) OPTIME
TOPICAL | Status: DC | PRN
  Administered 2023-08-31: 100 mL

## 2023-08-31 MED ORDER — HYDROCODONE-ACETAMINOPHEN 5-325 MG PO TABS: 1.0000 | ORAL_TABLET | Freq: Four times a day (QID) | ORAL | 0 refills | Status: AC | PRN

## 2023-08-31 MED ORDER — CEFAZOLIN SODIUM-DEXTROSE 2-4 GM/100ML-% IV SOLN: 2.0000 g | INTRAVENOUS | Status: DC

## 2023-08-31 SURGICAL SUPPLY — 26 items
BLADE SURG 15 STRL LF DISP TIS (BLADE) ×4 IMPLANT
BNDG COHESIVE 2X5 TAN ST LF (GAUZE/BANDAGES/DRESSINGS) ×2 IMPLANT
BNDG COMPR ESMARK 4X3 LF (GAUZE/BANDAGES/DRESSINGS) IMPLANT
CHLORAPREP W/TINT 26 (MISCELLANEOUS) ×2 IMPLANT
CORD BIPOLAR FORCEPS 12FT (ELECTRODE) ×2 IMPLANT
COVER BACK TABLE 60X90IN (DRAPES) ×2 IMPLANT
COVER MAYO STAND STRL (DRAPES) ×2 IMPLANT
CUFF TOURN SGL QUICK 18X4 (TOURNIQUET CUFF) ×2 IMPLANT
DRAPE EXTREMITY T 121X128X90 (DISPOSABLE) ×2 IMPLANT
DRAPE SURG 17X23 STRL (DRAPES) ×2 IMPLANT
GAUZE SPONGE 4X4 12PLY STRL (GAUZE/BANDAGES/DRESSINGS) ×2 IMPLANT
GAUZE XEROFORM 1X8 LF (GAUZE/BANDAGES/DRESSINGS) ×2 IMPLANT
GLOVE BIO SURGEON STRL SZ7.5 (GLOVE) ×2 IMPLANT
GLOVE BIOGEL PI IND STRL 8 (GLOVE) ×2 IMPLANT
GOWN STRL REUS W/ TWL LRG LVL3 (GOWN DISPOSABLE) ×2 IMPLANT
GOWN STRL REUS W/TWL XL LVL3 (GOWN DISPOSABLE) ×2 IMPLANT
NDL HYPO 25X1 1.5 SAFETY (NEEDLE) ×2 IMPLANT
NEEDLE HYPO 25X1 1.5 SAFETY (NEEDLE) ×1 IMPLANT
NS IRRIG 1000ML POUR BTL (IV SOLUTION) ×2 IMPLANT
PACK BASIN DAY SURGERY FS (CUSTOM PROCEDURE TRAY) ×2 IMPLANT
STOCKINETTE 4X48 STRL (DRAPES) ×2 IMPLANT
SUT ETHILON 4 0 PS 2 18 (SUTURE) ×2 IMPLANT
SYR BULB EAR ULCER 3OZ GRN STR (SYRINGE) ×2 IMPLANT
SYR CONTROL 10ML LL (SYRINGE) ×2 IMPLANT
TOWEL GREEN STERILE FF (TOWEL DISPOSABLE) ×4 IMPLANT
UNDERPAD 30X36 HEAVY ABSORB (UNDERPADS AND DIAPERS) ×2 IMPLANT

## 2023-08-31 NOTE — Op Note (Addendum)
 08/31/2023 [facility]  Operative Note  PREOPERATIVE DIAGNOSIS: RIGHT LONG FINGER TRIGGER DIGIT  POSTOPERATIVE DIAGNOSIS:  RIGHT LONG FINGER TRIGGER DIGIT  PROCEDURE: Procedure(s): MINOR RELEASE, A1 PULLEY, FOR TRIGGER FINGER   SURGEON:  Franky Curia, MD  ASSISTANT:  none.  ANESTHESIA:  Local.  IV FLUIDS:  Per anesthesia flow sheet.  ESTIMATED BLOOD LOSS:  Minimal.  COMPLICATIONS:  None.  SPECIMENS:  None.  TOURNIQUET TIME:  Total Tourniquet Time Documented: Forearm (Right) - 8 minutes Total: Forearm (Right) - 8 minutes   DISPOSITION:  Stable to PACU.  LOCATION: [facility]  INDICATIONS: Susan Randall is a 89 y.o. female with triggering of the long finger.  This has been injected without lasting resolution.  Susan Randall wishes to proceed with surgical trigger release.  Risks, benefits and alternatives of surgery were discussed including the risk of blood loss, infection, damage to nerves, vessels, tendons, ligaments, bone, failure of surgery, need for additional surgery, complications with wound healing, continued pain, continued triggering and need for repeat surgery.  Susan Randall voiced understanding of these risks and elected to proceed.  OPERATIVE COURSE:  After being identified preoperatively by myself, the patient and I agreed upon the procedure and site of procedure.  The surgical site was marked. Surgical consent had been signed. No antibiotics given for this soft tissue procedure. Susan Randall was transported to the operating room and placed on the operating room table in supine position with the Right upper extremity on an arm board. A surgical pause was performed between surgeons, patient, and operating room staff, and all were in agreement as to the patient, procedure, and site of procedure.  Half and half 1% plain lidocaine  and 0.25% plain marcaine  was injected at the surgical site.  The Right upper extremity was prepped and draped in normal sterile orthopedic  fashion. A surgical pause was performed between surgeons, patient, and operating room staff, and all were in agreement as to the patient, procedure, and site of procedure.  Tourniquet at the proximal aspect of the forearm was inflated to 250 mmHg after exsanguination of the arm with an Esmarch bandage.  An incision was made at the volar aspect of the MP joint of the long finger.  This was carried into the subcutaneous tissues by spreading technique.  Bipolar electrocautery was used to obtain hemostasis.  The radial and ulnar digital nerves were protected throughout the case. The flexor sheath was identified.  The A1 pulley was identified and sharply incised.  It was released in its entirety.  The proximal 1-2 mm of the A2 pulley was vented to allow better excursion of the tendons.  The finger was placed through a range of motion and there was noted to be no catching.  The tendons were brought through the wound and any adherences released.  The wound was then copiously irrigated with sterile saline. It was closed with 4-0 nylon in a horizontal mattress fashion.  It was injected with 0.25% plain Marcaine  to aid in postoperative analgesia.  It was dressed with sterile Xeroform, 4x4s, and wrapped lightly with a Coban dressing.  Tourniquet was deflated at 8 minutes.  The fingertips were pink with brisk capillary refill after deflation of the tourniquet.  The operative drapes were broken down.  Susan Randall was transferred back to the stretcher and taken to recover in stable condition.   I will see her back in the office in 1 week for postoperative followup.  I will give her a prescription for Norco 5/325  1 tab PO q6 hours prn pain, dispense #15.    Abrish Erny, MD Electronically signed, 08/31/23

## 2023-08-31 NOTE — Anesthesia Preprocedure Evaluation (Deleted)
 Anesthesia Evaluation    Reviewed: Allergy & Precautions, Patient's Chart, lab work & pertinent test results  History of Anesthesia Complications Negative for: history of anesthetic complications  Airway        Dental   Pulmonary neg pulmonary ROS          Cardiovascular hypertension, Pt. on medications      Neuro/Psych   Anxiety     negative neurological ROS     GI/Hepatic negative GI ROS, Neg liver ROS,,,  Endo/Other  diabetes (on Ozempic), Type 2Hypothyroidism    Renal/GU negative Renal ROS  negative genitourinary   Musculoskeletal Trigger middle finger of right hand   Abdominal   Peds  Hematology  (+) REFUSES BLOOD PRODUCTS  Anesthesia Other Findings Day of surgery medications reviewed with patient.  Reproductive/Obstetrics                              Anesthesia Physical Anesthesia Plan  ASA: 2  Anesthesia Plan: MAC   Post-op Pain Management: Tylenol  PO (pre-op)*   Induction:   PONV Risk Score and Plan: Treatment may vary due to age or medical condition and Propofol infusion  Airway Management Planned: Natural Airway and Simple Face Mask  Additional Equipment: None  Intra-op Plan:   Post-operative Plan:   Informed Consent:   Plan Discussed with:   Anesthesia Plan Comments:         Anesthesia Quick Evaluation

## 2023-08-31 NOTE — H&P (Signed)
 Susan Randall is an 89 y.o. female.   Chief Complaint: trigger digit HPI: 89 yo female with triggering right long finger.  This has been injected without lasting resolution.  She wishes to proceed with surgical trigger release.  Allergies: No Known Allergies  Past Medical History:  Diagnosis Date   Anxiety    Aortic stenosis    mod-severe   Arthritis    Breast density    right   Cholesterol retinal embolus    Hyperlipidemia    Hypertension    Insomnia 07/07/2007   Lipid disorder    Obesity    Osteopenia    Osteoporosis     Past Surgical History:  Procedure Laterality Date   APPENDECTOMY  1944    Family History: Family History  Problem Relation Age of Onset   Diabetes Mother    Heart disease Mother    Hypertension Mother     Social History:   reports that she has never smoked. She has never used smokeless tobacco. She reports current alcohol use of about 3.0 - 4.0 standard drinks of alcohol per week. She reports that she does not use drugs.  Medications: Medications Prior to Admission  Medication Sig Dispense Refill   aspirin  EC 81 MG tablet Take 1 tablet (81 mg total) by mouth daily. Swallow whole. 30 tablet 12   Cholecalciferol (VITAMIN D3) 1.25 MG (50000 UT) CAPS Take 1 capsule by mouth once a week.     diltiazem (TIAZAC) 180 MG 24 hr capsule Take 180 mg by mouth daily.     fexofenadine (ALLEGRA) 180 MG tablet Take 180 mg by mouth daily.     fluticasone (FLONASE) 50 MCG/ACT nasal spray Place 1 spray into both nostrils daily.     levothyroxine  (SYNTHROID ) 50 MCG tablet Take 1 tablet by mouth daily.     montelukast  (SINGULAIR ) 10 MG tablet Take 1 tablet by mouth at bedtime.     omega-3 acid ethyl esters (LOVAZA ) 1 g capsule Take 1 g by mouth 2 (two) times daily.     Semaglutide (OZEMPIC, 0.25 OR 0.5 MG/DOSE, [place]) Inject into the skin.     simvastatin (ZOCOR) 40 MG tablet Take 40 mg by mouth every evening.     valsartan-hydrochlorothiazide (DIOVAN-HCT) 160-25 MG  tablet Take 1 tablet by mouth daily.      No results found for this or any previous visit (from the past 48 hours).  No results found.    Height 5' 3 (1.6 m), weight 87.1 kg.  General appearance: alert, cooperative, and appears stated age Head: Normocephalic, without obvious abnormality, atraumatic Neck: supple, symmetrical, trachea midline Extremities: Intact sensation and capillary refill all digits.  +epl/fpl/io.  No wounds.  Skin: Skin color, texture, turgor normal. No rashes or lesions Neurologic: Grossly normal Incision/Wound: none  Assessment/Plan Right long finger trigger digit.  Non operative and operative treatment options have been discussed with the patient and patient wishes to proceed with operative treatment. Risks, benefits, and alternatives of surgery have been discussed and the patient agrees with the plan of care.   Susan Randall 08/31/2023, 1:23 PM

## 2023-08-31 NOTE — Discharge Instructions (Signed)

## 2023-09-01 ENCOUNTER — Encounter (HOSPITAL_BASED_OUTPATIENT_CLINIC_OR_DEPARTMENT_OTHER): Payer: Self-pay | Admitting: Orthopedic Surgery

## 2023-09-08 DIAGNOSIS — M65331 Trigger finger, right middle finger: Secondary | ICD-10-CM | POA: Diagnosis not present

## 2023-09-14 DIAGNOSIS — M65331 Trigger finger, right middle finger: Secondary | ICD-10-CM | POA: Diagnosis not present

## 2023-09-23 DIAGNOSIS — M79671 Pain in right foot: Secondary | ICD-10-CM | POA: Diagnosis not present

## 2023-09-23 DIAGNOSIS — M79672 Pain in left foot: Secondary | ICD-10-CM | POA: Diagnosis not present

## 2023-09-23 DIAGNOSIS — M722 Plantar fascial fibromatosis: Secondary | ICD-10-CM | POA: Diagnosis not present

## 2023-10-01 DIAGNOSIS — Z6833 Body mass index (BMI) 33.0-33.9, adult: Secondary | ICD-10-CM | POA: Diagnosis not present

## 2023-10-01 DIAGNOSIS — R6510 Systemic inflammatory response syndrome (SIRS) of non-infectious origin without acute organ dysfunction: Secondary | ICD-10-CM | POA: Diagnosis not present

## 2023-10-01 DIAGNOSIS — E1169 Type 2 diabetes mellitus with other specified complication: Secondary | ICD-10-CM | POA: Diagnosis not present

## 2023-10-01 DIAGNOSIS — F064 Anxiety disorder due to known physiological condition: Secondary | ICD-10-CM | POA: Diagnosis not present

## 2023-10-01 DIAGNOSIS — K5909 Other constipation: Secondary | ICD-10-CM | POA: Diagnosis not present

## 2023-10-01 DIAGNOSIS — F4389 Other reactions to severe stress: Secondary | ICD-10-CM | POA: Diagnosis not present

## 2023-10-01 DIAGNOSIS — F4381 Prolonged grief disorder: Secondary | ICD-10-CM | POA: Diagnosis not present

## 2023-10-01 DIAGNOSIS — R7303 Prediabetes: Secondary | ICD-10-CM | POA: Diagnosis not present

## 2023-10-01 DIAGNOSIS — E785 Hyperlipidemia, unspecified: Secondary | ICD-10-CM | POA: Diagnosis not present

## 2023-10-06 DIAGNOSIS — M62541 Muscle wasting and atrophy, not elsewhere classified, right hand: Secondary | ICD-10-CM | POA: Diagnosis not present

## 2023-10-06 DIAGNOSIS — R278 Other lack of coordination: Secondary | ICD-10-CM | POA: Diagnosis not present

## 2023-10-06 DIAGNOSIS — M65331 Trigger finger, right middle finger: Secondary | ICD-10-CM | POA: Diagnosis not present

## 2023-10-08 DIAGNOSIS — M62541 Muscle wasting and atrophy, not elsewhere classified, right hand: Secondary | ICD-10-CM | POA: Diagnosis not present

## 2023-10-08 DIAGNOSIS — Z23 Encounter for immunization: Secondary | ICD-10-CM | POA: Diagnosis not present

## 2023-10-08 DIAGNOSIS — R278 Other lack of coordination: Secondary | ICD-10-CM | POA: Diagnosis not present

## 2023-10-08 DIAGNOSIS — M65331 Trigger finger, right middle finger: Secondary | ICD-10-CM | POA: Diagnosis not present

## 2023-10-12 DIAGNOSIS — M65331 Trigger finger, right middle finger: Secondary | ICD-10-CM | POA: Diagnosis not present

## 2023-10-12 DIAGNOSIS — M62541 Muscle wasting and atrophy, not elsewhere classified, right hand: Secondary | ICD-10-CM | POA: Diagnosis not present

## 2023-10-12 DIAGNOSIS — R278 Other lack of coordination: Secondary | ICD-10-CM | POA: Diagnosis not present

## 2023-10-22 DIAGNOSIS — M62541 Muscle wasting and atrophy, not elsewhere classified, right hand: Secondary | ICD-10-CM | POA: Diagnosis not present

## 2023-10-22 DIAGNOSIS — M65331 Trigger finger, right middle finger: Secondary | ICD-10-CM | POA: Diagnosis not present

## 2023-10-22 DIAGNOSIS — R278 Other lack of coordination: Secondary | ICD-10-CM | POA: Diagnosis not present

## 2023-10-27 DIAGNOSIS — M62541 Muscle wasting and atrophy, not elsewhere classified, right hand: Secondary | ICD-10-CM | POA: Diagnosis not present

## 2023-10-27 DIAGNOSIS — M65331 Trigger finger, right middle finger: Secondary | ICD-10-CM | POA: Diagnosis not present

## 2023-10-27 DIAGNOSIS — R278 Other lack of coordination: Secondary | ICD-10-CM | POA: Diagnosis not present

## 2023-10-29 DIAGNOSIS — R278 Other lack of coordination: Secondary | ICD-10-CM | POA: Diagnosis not present

## 2023-10-29 DIAGNOSIS — M65331 Trigger finger, right middle finger: Secondary | ICD-10-CM | POA: Diagnosis not present

## 2023-10-29 DIAGNOSIS — M62541 Muscle wasting and atrophy, not elsewhere classified, right hand: Secondary | ICD-10-CM | POA: Diagnosis not present

## 2023-11-03 DIAGNOSIS — R278 Other lack of coordination: Secondary | ICD-10-CM | POA: Diagnosis not present

## 2023-11-03 DIAGNOSIS — M65331 Trigger finger, right middle finger: Secondary | ICD-10-CM | POA: Diagnosis not present

## 2023-11-03 DIAGNOSIS — M62541 Muscle wasting and atrophy, not elsewhere classified, right hand: Secondary | ICD-10-CM | POA: Diagnosis not present

## 2023-11-05 DIAGNOSIS — M62541 Muscle wasting and atrophy, not elsewhere classified, right hand: Secondary | ICD-10-CM | POA: Diagnosis not present

## 2023-11-05 DIAGNOSIS — R278 Other lack of coordination: Secondary | ICD-10-CM | POA: Diagnosis not present

## 2023-11-05 DIAGNOSIS — M65331 Trigger finger, right middle finger: Secondary | ICD-10-CM | POA: Diagnosis not present

## 2023-11-06 DIAGNOSIS — Z1231 Encounter for screening mammogram for malignant neoplasm of breast: Secondary | ICD-10-CM | POA: Diagnosis not present

## 2023-11-10 DIAGNOSIS — M65331 Trigger finger, right middle finger: Secondary | ICD-10-CM | POA: Diagnosis not present

## 2023-11-10 DIAGNOSIS — M852 Hyperostosis of skull: Secondary | ICD-10-CM | POA: Diagnosis not present

## 2023-11-10 DIAGNOSIS — M62541 Muscle wasting and atrophy, not elsewhere classified, right hand: Secondary | ICD-10-CM | POA: Diagnosis not present

## 2023-11-10 DIAGNOSIS — M85851 Other specified disorders of bone density and structure, right thigh: Secondary | ICD-10-CM | POA: Diagnosis not present

## 2023-11-10 DIAGNOSIS — R278 Other lack of coordination: Secondary | ICD-10-CM | POA: Diagnosis not present

## 2023-12-09 DIAGNOSIS — H35363 Drusen (degenerative) of macula, bilateral: Secondary | ICD-10-CM | POA: Diagnosis not present

## 2023-12-09 DIAGNOSIS — H35362 Drusen (degenerative) of macula, left eye: Secondary | ICD-10-CM | POA: Diagnosis not present

## 2023-12-09 DIAGNOSIS — H40023 Open angle with borderline findings, high risk, bilateral: Secondary | ICD-10-CM | POA: Diagnosis not present
